# Patient Record
Sex: Female | Born: 1940 | Race: White | Hispanic: No | Marital: Married | Smoking: Never smoker
Health system: Southern US, Community
[De-identification: ages and names within clinical notes are randomized; demographics above are authoritative.]

## PROBLEM LIST (undated history)

## (undated) DIAGNOSIS — H269 Unspecified cataract: Secondary | ICD-10-CM

## (undated) DIAGNOSIS — M810 Age-related osteoporosis without current pathological fracture: Secondary | ICD-10-CM

## (undated) DIAGNOSIS — E538 Deficiency of other specified B group vitamins: Secondary | ICD-10-CM

## (undated) DIAGNOSIS — R238 Other skin changes: Secondary | ICD-10-CM

## (undated) DIAGNOSIS — F329 Major depressive disorder, single episode, unspecified: Secondary | ICD-10-CM

## (undated) DIAGNOSIS — R233 Spontaneous ecchymoses: Secondary | ICD-10-CM

## (undated) DIAGNOSIS — R29898 Other symptoms and signs involving the musculoskeletal system: Secondary | ICD-10-CM

## (undated) DIAGNOSIS — G629 Polyneuropathy, unspecified: Secondary | ICD-10-CM

## (undated) DIAGNOSIS — E785 Hyperlipidemia, unspecified: Secondary | ICD-10-CM

## (undated) DIAGNOSIS — I10 Essential (primary) hypertension: Secondary | ICD-10-CM

## (undated) DIAGNOSIS — M1990 Unspecified osteoarthritis, unspecified site: Secondary | ICD-10-CM

## (undated) DIAGNOSIS — R600 Localized edema: Secondary | ICD-10-CM

## (undated) DIAGNOSIS — E7800 Pure hypercholesterolemia, unspecified: Secondary | ICD-10-CM

## (undated) DIAGNOSIS — M2550 Pain in unspecified joint: Secondary | ICD-10-CM

## (undated) DIAGNOSIS — E782 Mixed hyperlipidemia: Secondary | ICD-10-CM

## (undated) DIAGNOSIS — R2689 Other abnormalities of gait and mobility: Secondary | ICD-10-CM

## (undated) DIAGNOSIS — F32A Depression, unspecified: Secondary | ICD-10-CM

## (undated) DIAGNOSIS — R609 Edema, unspecified: Secondary | ICD-10-CM

## (undated) DIAGNOSIS — L0100 Impetigo, unspecified: Secondary | ICD-10-CM

## (undated) HISTORY — DX: Spontaneous ecchymoses: R23.3

## (undated) HISTORY — DX: Other skin changes: R23.8

## (undated) HISTORY — DX: Depression, unspecified: F32.A

## (undated) HISTORY — DX: Edema, unspecified: R60.9

## (undated) HISTORY — DX: Unspecified osteoarthritis, unspecified site: M19.90

## (undated) HISTORY — PX: BREAST SURGERY: SHX581

## (undated) HISTORY — DX: Impetigo, unspecified: L01.00

## (undated) HISTORY — DX: Other abnormalities of gait and mobility: R26.89

## (undated) HISTORY — DX: Age-related osteoporosis without current pathological fracture: M81.0

## (undated) HISTORY — DX: Polyneuropathy, unspecified: G62.9

## (undated) HISTORY — PX: ABDOMINAL HYSTERECTOMY: SHX81

## (undated) HISTORY — DX: Mixed hyperlipidemia: E78.2

## (undated) HISTORY — PX: CATARACT EXTRACTION: SUR2

## (undated) HISTORY — DX: Pain in unspecified joint: M25.50

## (undated) HISTORY — DX: Localized edema: R60.0

## (undated) HISTORY — DX: Pure hypercholesterolemia, unspecified: E78.00

## (undated) HISTORY — DX: Essential (primary) hypertension: I10

## (undated) HISTORY — DX: Other symptoms and signs involving the musculoskeletal system: R29.898

## (undated) HISTORY — DX: Hyperlipidemia, unspecified: E78.5

## (undated) HISTORY — PX: OTHER SURGICAL HISTORY: SHX169

## (undated) HISTORY — DX: Major depressive disorder, single episode, unspecified: F32.9

## (undated) HISTORY — DX: Deficiency of other specified B group vitamins: E53.8

## (undated) HISTORY — DX: Unspecified cataract: H26.9

---

## 1976-01-17 HISTORY — PX: ABDOMINAL HYSTERECTOMY: SHX81

## 2003-01-20 ENCOUNTER — Encounter: Admission: RE | Admit: 2003-01-20 | Discharge: 2003-01-20 | Payer: Self-pay | Admitting: Family Medicine

## 2003-02-19 ENCOUNTER — Ambulatory Visit (HOSPITAL_BASED_OUTPATIENT_CLINIC_OR_DEPARTMENT_OTHER): Admission: RE | Admit: 2003-02-19 | Discharge: 2003-02-19 | Payer: Self-pay | Admitting: General Surgery

## 2003-02-19 ENCOUNTER — Encounter (INDEPENDENT_AMBULATORY_CARE_PROVIDER_SITE_OTHER): Payer: Self-pay | Admitting: Specialist

## 2003-02-19 ENCOUNTER — Ambulatory Visit (HOSPITAL_COMMUNITY): Admission: RE | Admit: 2003-02-19 | Discharge: 2003-02-19 | Payer: Self-pay | Admitting: General Surgery

## 2004-02-03 ENCOUNTER — Encounter: Admission: RE | Admit: 2004-02-03 | Discharge: 2004-02-03 | Payer: Self-pay | Admitting: Family Medicine

## 2005-02-08 ENCOUNTER — Encounter: Admission: RE | Admit: 2005-02-08 | Discharge: 2005-02-08 | Payer: Self-pay | Admitting: Family Medicine

## 2009-09-07 ENCOUNTER — Encounter: Admission: RE | Admit: 2009-09-07 | Discharge: 2009-09-07 | Payer: Self-pay | Admitting: General Surgery

## 2009-09-09 ENCOUNTER — Ambulatory Visit (HOSPITAL_BASED_OUTPATIENT_CLINIC_OR_DEPARTMENT_OTHER): Admission: RE | Admit: 2009-09-09 | Discharge: 2009-09-09 | Payer: Self-pay | Admitting: General Surgery

## 2010-03-31 LAB — DIFFERENTIAL
Basophils Absolute: 0 10*3/uL (ref 0.0–0.1)
Basophils Relative: 0 % (ref 0–1)
Eosinophils Absolute: 0.3 10*3/uL (ref 0.0–0.7)
Eosinophils Relative: 3 % (ref 0–5)
Lymphocytes Relative: 18 % (ref 12–46)
Lymphs Abs: 1.9 10*3/uL (ref 0.7–4.0)
Monocytes Absolute: 0.7 10*3/uL (ref 0.1–1.0)
Monocytes Relative: 6 % (ref 3–12)
Neutro Abs: 7.7 10*3/uL (ref 1.7–7.7)
Neutrophils Relative %: 72 % (ref 43–77)

## 2010-03-31 LAB — CBC
HCT: 41.9 % (ref 36.0–46.0)
Hemoglobin: 14.4 g/dL (ref 12.0–15.0)
MCH: 30.4 pg (ref 26.0–34.0)
MCHC: 34.4 g/dL (ref 30.0–36.0)
MCV: 88.4 fL (ref 78.0–100.0)
Platelets: 191 10*3/uL (ref 150–400)
RBC: 4.74 MIL/uL (ref 3.87–5.11)
RDW: 13.5 % (ref 11.5–15.5)
WBC: 10.6 10*3/uL — ABNORMAL HIGH (ref 4.0–10.5)

## 2010-03-31 LAB — BASIC METABOLIC PANEL
BUN: 15 mg/dL (ref 6–23)
CO2: 32 mEq/L (ref 19–32)
Calcium: 9.9 mg/dL (ref 8.4–10.5)
Chloride: 105 mEq/L (ref 96–112)
Creatinine, Ser: 0.74 mg/dL (ref 0.4–1.2)
GFR calc Af Amer: >60 mL/min (ref >60)
GFR calc non Af Amer: >60 mL/min (ref >60)
Glucose, Bld: 88 mg/dL (ref 70–99)
Potassium: 5.2 mEq/L — ABNORMAL HIGH (ref 3.5–5.1)
Sodium: 142 mEq/L (ref 135–145)

## 2010-06-03 NOTE — Op Note (Signed)
NAME:  Jasmine Copeland, Jasmine Copeland                           ACCOUNT NO.:  [id number]   MEDICAL RECORD NO.:  [id number]                   PATIENT TYPE:  AMB   LOCATION:  DSC                                  FACILITY:  [facility]   PHYSICIAN:  Rose Phi. Maple Hudson, M.D.                DATE OF BIRTH:  June 28, 1940   DATE OF PROCEDURE:  02/19/2003  DATE OF DISCHARGE:                                 OPERATIVE REPORT   PREOPERATIVE DIAGNOSIS:  Intraductal papilloma, right breast.   POSTOPERATIVE DIAGNOSES:  Intraductal papilloma, right breast.   PROCEDURE:  Excision of intraductal papilloma, right breast.   SURGEON:  Rose Phi. Maple Hudson, M.D.   ANESTHESIA:  MAC.   DESCRIPTION OF PROCEDURE:  The patient was placed on the operating table and  the right breast was prepped and draped in the usual fashion.  The nipple  discharge with a little blood tinge to it came from the 5 o'clock position.  A curved circumareolar incision was then outlined using that as a guide.  I  then infiltrated the area with a mixture of 1% Xylocaine and 0.25% Marcaine.  The incision was made and I then elevated the areolar flap to under the  nipple and then I could see the dilated duct going in at the 5 o'clock  position.  I divided the nipple and then excised the duct system.  Hemostasis was obtained with the cautery.  Subcuticular closure with 4-0  Monocryl and Steri-Strips was carried out.  A dressing was applied.  The  patient was transferred to the recovery room in satisfactory condition  having tolerated the procedure well.                                               Rose Phi. Maple Hudson, M.D.    PRY/MEDQ  D:  02/19/2003  T:  02/19/2003  Job:  [id number]

## 2012-12-10 ENCOUNTER — Ambulatory Visit: Payer: Self-pay | Admitting: Podiatrist

## 2013-06-04 ENCOUNTER — Ambulatory Visit (INDEPENDENT_AMBULATORY_CARE_PROVIDER_SITE_OTHER): Payer: Commercial Managed Care - HMO

## 2013-06-04 VITALS — BP 132/62 | HR 63 | Resp 18

## 2013-06-04 DIAGNOSIS — M722 Plantar fascial fibromatosis: Secondary | ICD-10-CM | POA: Diagnosis not present

## 2013-06-04 DIAGNOSIS — M79609 Pain in unspecified limb: Secondary | ICD-10-CM

## 2013-06-04 NOTE — Progress Notes (Signed)
   Subjective:    Patient ID: Jasmine SellerLinda L Copeland, female    DOB: 11/15/1940, 73 y.o.   MRN: [id number]  HPI my right heel has been going on since last fall and in the center of the heel it is sore as a boil and it finally came out and then went to Dr Hal Neerillis and he x-rayed it and didn't get a shot and gave exercises and then I went back 4 to 6 weeks later and gave a shot and it was no better and then on another visit he gave me another shot in a different spot and I showed him the inserts I got from the store and I got new balance like he said    Review of Systems  Constitutional: Positive for activity change.  Eyes: Positive for visual disturbance.  Musculoskeletal: Positive for gait problem.  Hematological: Bruises/bleeds easily.  All other systems reviewed and are negative.      Objective:   Physical Exam 73-year-old white female presents this time well-developed well-nourished oriented x3 and complaint of the inferior right heel pain greater than 6-9 months patient has tried some treatments has had steroid injections try some over-the-counter heel cups is wearing different shoes although currently wearing a very flimsy soft shoe with inadequate support. Hurts most when she tries to walk barefoot at home her for get up in the middle of the night to the bathroom  Lower extremity objective findings as follows that neurovascular status is intact pedal pulses palpable DP and PT +2/4 bilateral Refill time 3 seconds all digits epicritic and proprioceptive sensations intact and symmetric bilateral there is normal plantar response DTRs not listed dermatologically skin color pigment normal slight yellowing of left hallux nail noted a small the keratoses sub-heel on the right is noted although not painful on direct lateral compression orthopedic biomechanical exam reveals x-rays well-developed inferior calcaneal spurring thickening plantar fascial structures noted there is pain on palpation healed plantar  fascia tubercle medial band of the plantar fascia on the right foot. Left foot asymptomatic.       Assessment & Plan:  Assessment this time is plantar fasciitis/heel spur syndrome right foot plan at this time patient placed in fascial strapping reappointed to followup and reassessment may be candidate for orthoses based on progress no barefoot or flimsy shoes or flip-flops did recommend crocs or Birkenstock for around the house also ice to the heel every evening an NSAID on an as-needed basis reappointed 2 weeks  Alvan Dameichard Analina Filla DPM

## 2013-06-04 NOTE — Patient Instructions (Addendum)
ICE INSTRUCTIONS  Apply ice or cold pack to the affected area at least 3 times a day for 10-15 minutes each time.  You should also use ice after prolonged activity or vigorous exercise.  Do not apply ice longer than 20 minutes at one time.  Always keep a cloth between your skin and the ice pack to prevent burns.  Being consistent and following these instructions will help control your symptoms.  We suggest you purchase a gel ice pack because they are reusable and do bit leak.  Some of them are designed to wrap around the area.  Use the method that works best for you.  Here are some other suggestions for icing.   Use a frozen bag of peas or corn-inexpensive and molds well to your body, usually stays frozen for 10 to 20 minutes.  Wet a towel with cold water and squeeze out the excess until it's damp.  Place in a bag in the freezer for 20 minutes. Then remove and use.  Recommendations for shoes around the house: Suggests wearing crocs or a Birkenstock-type sandal around the house the make appropriate bedroom shoe or slipper. No barefoot no flimsy shoes no flip-flops

## 2013-06-18 ENCOUNTER — Ambulatory Visit (INDEPENDENT_AMBULATORY_CARE_PROVIDER_SITE_OTHER): Payer: Commercial Managed Care - HMO

## 2013-06-18 VITALS — BP 151/68 | HR 71 | Resp 18

## 2013-06-18 DIAGNOSIS — M722 Plantar fascial fibromatosis: Secondary | ICD-10-CM

## 2013-06-18 DIAGNOSIS — M79609 Pain in unspecified limb: Secondary | ICD-10-CM

## 2013-06-18 MED ORDER — MELOXICAM 15 MG PO TABS: 15.0000 mg | ORAL_TABLET | Freq: Every day | ORAL | Status: DC

## 2013-06-18 MED ORDER — TRIAMCINOLONE ACETONIDE 10 MG/ML IJ SUSP: 10.0000 mg | Freq: Once | INTRAMUSCULAR | Status: AC

## 2013-06-18 NOTE — Patient Instructions (Signed)

## 2013-06-18 NOTE — Progress Notes (Signed)
   Subjective:    Patient ID: Jasmine Copeland, female    DOB: [birth year], 73 y.o.   MRN: [id number]  HPI MY RIGHT HEEL IS STILL SORE AND I COULD NOT TELL A DIFFERENCE IN THE TAPING THAT WAS PUT ON AND IT HURTS    Review of Systems no new findings or systemic changes noted     Objective:   Physical Exam Neurovascular status is intact pedal pulses are palpable epicritic and proprioceptive sensations unchanged patient continues to have pain inferior heel area of her right foot for medial band plantar fascia medial calcaneal tubercle area. There was some slight improvement with taping for a period of time with some symptomology however in general continues to pain symptoms has had 2 steroid injections back in December and January with Dr. Berna Spare however it has not any since then had cataract surgery recently had to be off of her NSAID therapies also did not help with improvement. Base and this time patient candidate for orthoses tried OTC type orthotic power step orthotics are dispensed and fitted to the patient's foot at this time with written break in wearing instructions and given. Also at this time as she continues to have pain in symptomology injection tender with Kenalog 20 mg Xylocaine plain infiltrated to the inferior calcaneal area. Fascial strapping is also apply for temporary relief. Break in period for the orthoses.       Assessment & Plan:  Saethre-Chotzen plantar fasciitis/heel spur syndrome will follow biomechanical support utilizing orthotics and fascial strapping at this time an injection tender with Kenalog infiltrated to the inferior right heel recommended to maintain ice and NSAID for prescription MOBIC is: The patient's pharmacy 15 mg once daily as needed for pain recheck in one month for followup and reevaluation possible orthotic adjustment if needed  Alvan Dame DPM

## 2013-07-16 ENCOUNTER — Ambulatory Visit: Payer: Commercial Managed Care - HMO

## 2013-07-23 ENCOUNTER — Ambulatory Visit (INDEPENDENT_AMBULATORY_CARE_PROVIDER_SITE_OTHER): Payer: Commercial Managed Care - HMO

## 2013-07-23 ENCOUNTER — Ambulatory Visit: Payer: Commercial Managed Care - HMO

## 2013-07-23 VITALS — BP 172/76 | HR 58 | Resp 18

## 2013-07-23 DIAGNOSIS — M79609 Pain in unspecified limb: Secondary | ICD-10-CM

## 2013-07-23 DIAGNOSIS — M79606 Pain in leg, unspecified: Secondary | ICD-10-CM

## 2013-07-23 DIAGNOSIS — M722 Plantar fascial fibromatosis: Secondary | ICD-10-CM

## 2013-07-23 NOTE — Patient Instructions (Signed)
ICE INSTRUCTIONS  Apply ice or cold pack to the affected area at least 3 times a day for 10-15 minutes each time.  You should also use ice after prolonged activity or vigorous exercise.  Do not apply ice longer than 20 minutes at one time.  Always keep a cloth between your skin and the ice pack to prevent burns.  Being consistent and following these instructions will help control your symptoms.  We suggest you purchase a gel ice pack because they are reusable and do bit leak.  Some of them are designed to wrap around the area.  Use the method that works best for you.  Here are some other suggestions for icing.   Use a frozen bag of peas or corn-inexpensive and molds well to your body, usually stays frozen for 10 to 20 minutes.  Wet a towel with cold water and squeeze out the excess until it's damp.  Place in a bag in the freezer for 20 minutes. Then remove and use.  Alternative ice method is use a frozen bottle of water like a frozen rolling pin massage and cool down the foot and arch

## 2013-07-23 NOTE — Progress Notes (Signed)
   Subjective:    Patient ID: Jasmine Copeland, female    DOB: 08/05/1940, 73 y.o.   MRN: [id number]  HPI THE INSERTS THAT I GOT FOR $45.00 IT IS HURTING ON THE INSTEP OF BOTH MY FEET AND I FEEL LIKE I AM AT THE BEING AGAIN AND THE INJECTION DID HELP SOME AND THE WRAP DID HELP    Review of Systems no new findings or systemic changes noted     Objective:   Physical Exam Neurovascular status is intact pedal pulses are palpable bilateral epicritic and proprioceptive sensations intact the patient case for the first 4 weeks the orthotics and she felt fine made in the shot in the medications however in the last 2 weeks [place] of her heel pain once again both heel and mid arch pain on the right foot pain on palpation medial band plantar fascia medial calcaneal tubercle on the right left is asymptomatic the orthotics that she seem to fit and contour well however this time added additional quarter-inch felt pad arch pads to the right orthotic in a fascial strapping is applied to the right foot. 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

## 2013-08-21 ENCOUNTER — Ambulatory Visit (INDEPENDENT_AMBULATORY_CARE_PROVIDER_SITE_OTHER): Payer: Commercial Managed Care - HMO

## 2013-08-21 VITALS — BP 186/82 | HR 59 | Resp 18

## 2013-08-21 DIAGNOSIS — M79606 Pain in leg, unspecified: Secondary | ICD-10-CM

## 2013-08-21 DIAGNOSIS — M7750 Other enthesopathy of unspecified foot: Secondary | ICD-10-CM

## 2013-08-21 DIAGNOSIS — M778 Other enthesopathies, not elsewhere classified: Secondary | ICD-10-CM

## 2013-08-21 DIAGNOSIS — M779 Enthesopathy, unspecified: Secondary | ICD-10-CM

## 2013-08-21 DIAGNOSIS — M79609 Pain in unspecified limb: Secondary | ICD-10-CM

## 2013-08-21 DIAGNOSIS — M722 Plantar fascial fibromatosis: Secondary | ICD-10-CM

## 2013-08-21 NOTE — Patient Instructions (Signed)
Physical Therapy Prescription Your caregiver wants you to have physical therapy. Please take this instruction to the physical therapist recommended by your caregiver. Your prescription is: _x_ Evaluate and treat using standard techniques. ___ Evaluate and contact physician before starting treatment. _x Heat therapy: ( ) Ultrasound ( ) Hot Packs ( ) Paraffin. __x_ Electrotherapy: ( ) TENS ( ) Electrical Stimulation. ___ Hydrotherapy. ___ Cryotherapy. _x__ Therapeutic Exercise: ( ) Back ( ) Neck ( ) Gait ( ) Home program. ___ Massage. ___ Traction: ( ) Pelvic ( ) Cervical. ___ Posture and body mechanics education. Treatment Goal: ___________________________________________________________ __3_ Times per week for _3__ weeks. Please send progress note to caregiver at the completion of therapy. Call caregiver if patient's condition becomes worse. Document Released: 02/10/2004 Document Revised: 03/27/2011 Document Reviewed: 01/02/2005 [facility] Patient Information 2015 [place] [employer], [place]. This information is not intended to replace advice given to you by your health care provider. Make sure you discuss any questions you have with your health care provider.   Alvan Dameichard Madia Carvell DPM

## 2013-08-21 NOTE — Progress Notes (Signed)
   Subjective:    Patient ID: Jasmine Copeland, female    DOB: 04/26/1940, 73 y.o.   MRN: [id number]  HPI MY HEELS ARE DOING ARE NOT DOING TOO GOOD AND THE INSERTS THAT I GOT DR Lynnae Ludemann PUT A PAD IN THERE AND IT WAS TOO MUCH AND THE TAPE MADE MY LEG HURT AND FELT LIKE A PULLING AND I BROKE MY FOOT ABOUT 14 YEARS AGO AND IT SEEMS LIKE THAT IS WORSE THAN THE HEELS    Review of Systems no new systemic findings or changes noted     Objective:   Physical Exam 73-year-old white female well-developed well-nourished. History presents this time for followup her heels continue painful symptomatic she is actually even altered gait and may have aggravated her lateral fifth metatarsal base where she had a fracture 14 years ago. Mass mild capsulitis Lisfranc joint lateral column as a result. Not painful at this time it resolved over the last couple of days. The adjustments are felt padding to the orthotics had to be removed and HEENT wear the OTC orthotic or power step insoles. Continues to pain on palpation medial band of the plantar fascia medial calcaneal tubercle bilateral right more so than left. Neurovascular status otherwise intact and unchanged pedal pulses palpable epicritic and proprioceptive sensations intact and symmetric bilateral there is normal plantar response DTRs not elicited [place] skin color pigment normal hair growth normal absent hair growth distally nails unremarkable.       Assessment & Plan:  Assessment this time for calcium plantar fasciitis/heel spur syndrome right more so than left discussed other options including following #1 physical therapy 3 times a week for 4 weeks for which a prescription was issued at this time. Patient will do physical therapy with orthopedic office where her husband is also having physical therapy status post knee surgery. Also discussed the possibilities of surgical intervention in the form of EPF and the possibilities of shockwave therapy or EPAT will  consider these options also the possibility of prescription orthoses versus the OTC orthotic she is currently using. Reevaluate with the next month or 2 if physical therapy hasn't provided improvement or relief  Alvan Dameichard Excell Neyland DPM

## 2013-09-03 ENCOUNTER — Telehealth: Payer: Self-pay | Admitting: *Deleted

## 2013-09-03 NOTE — Telephone Encounter (Signed)
We are requesting the orders for the patient signed by the doctor, copy of insurance card, demographics, and last office visit note.  Fax number is [phone].

## 2013-09-04 NOTE — Telephone Encounter (Signed)
Faxed all the paperwork to [name]. Jasmine Copeland

## 2015-01-26 DIAGNOSIS — H43813 Vitreous degeneration, bilateral: Secondary | ICD-10-CM | POA: Diagnosis not present

## 2015-01-26 DIAGNOSIS — H26492 Other secondary cataract, left eye: Secondary | ICD-10-CM | POA: Diagnosis not present

## 2015-01-26 DIAGNOSIS — Z961 Presence of intraocular lens: Secondary | ICD-10-CM | POA: Diagnosis not present

## 2015-01-26 DIAGNOSIS — H52223 Regular astigmatism, bilateral: Secondary | ICD-10-CM | POA: Diagnosis not present

## 2015-01-26 DIAGNOSIS — H5203 Hypermetropia, bilateral: Secondary | ICD-10-CM | POA: Diagnosis not present

## 2015-01-26 DIAGNOSIS — I10 Essential (primary) hypertension: Secondary | ICD-10-CM | POA: Diagnosis not present

## 2015-01-26 DIAGNOSIS — H524 Presbyopia: Secondary | ICD-10-CM | POA: Diagnosis not present

## 2015-01-26 DIAGNOSIS — H43393 Other vitreous opacities, bilateral: Secondary | ICD-10-CM | POA: Diagnosis not present

## 2015-01-26 DIAGNOSIS — H16223 Keratoconjunctivitis sicca, not specified as Sjogren's, bilateral: Secondary | ICD-10-CM | POA: Diagnosis not present

## 2015-01-26 DIAGNOSIS — H04123 Dry eye syndrome of bilateral lacrimal glands: Secondary | ICD-10-CM | POA: Diagnosis not present

## 2015-01-26 DIAGNOSIS — H35373 Puckering of macula, bilateral: Secondary | ICD-10-CM | POA: Diagnosis not present

## 2015-01-26 DIAGNOSIS — H26491 Other secondary cataract, right eye: Secondary | ICD-10-CM | POA: Diagnosis not present

## 2015-03-08 DIAGNOSIS — H04123 Dry eye syndrome of bilateral lacrimal glands: Secondary | ICD-10-CM | POA: Diagnosis not present

## 2015-06-02 DIAGNOSIS — Z1211 Encounter for screening for malignant neoplasm of colon: Secondary | ICD-10-CM | POA: Diagnosis not present

## 2015-06-02 DIAGNOSIS — E782 Mixed hyperlipidemia: Secondary | ICD-10-CM | POA: Diagnosis not present

## 2015-06-02 DIAGNOSIS — Z0000 Encounter for general adult medical examination without abnormal findings: Secondary | ICD-10-CM | POA: Diagnosis not present

## 2015-06-02 DIAGNOSIS — M8580 Other specified disorders of bone density and structure, unspecified site: Secondary | ICD-10-CM | POA: Diagnosis not present

## 2015-06-02 DIAGNOSIS — Z1389 Encounter for screening for other disorder: Secondary | ICD-10-CM | POA: Diagnosis not present

## 2015-06-02 DIAGNOSIS — Z79899 Other long term (current) drug therapy: Secondary | ICD-10-CM | POA: Diagnosis not present

## 2015-06-02 DIAGNOSIS — M159 Polyosteoarthritis, unspecified: Secondary | ICD-10-CM | POA: Diagnosis not present

## 2015-06-02 DIAGNOSIS — I10 Essential (primary) hypertension: Secondary | ICD-10-CM | POA: Diagnosis not present

## 2015-07-06 DIAGNOSIS — T887XXA Unspecified adverse effect of drug or medicament, initial encounter: Secondary | ICD-10-CM | POA: Diagnosis not present

## 2015-07-06 DIAGNOSIS — I10 Essential (primary) hypertension: Secondary | ICD-10-CM | POA: Diagnosis not present

## 2015-09-24 DIAGNOSIS — Z23 Encounter for immunization: Secondary | ICD-10-CM | POA: Diagnosis not present

## 2015-09-24 DIAGNOSIS — Z0000 Encounter for general adult medical examination without abnormal findings: Secondary | ICD-10-CM | POA: Diagnosis not present

## 2015-10-19 DIAGNOSIS — Z1231 Encounter for screening mammogram for malignant neoplasm of breast: Secondary | ICD-10-CM | POA: Diagnosis not present

## 2016-06-01 DIAGNOSIS — I10 Essential (primary) hypertension: Secondary | ICD-10-CM | POA: Diagnosis not present

## 2016-06-01 DIAGNOSIS — H26491 Other secondary cataract, right eye: Secondary | ICD-10-CM | POA: Diagnosis not present

## 2016-06-01 DIAGNOSIS — H26492 Other secondary cataract, left eye: Secondary | ICD-10-CM | POA: Diagnosis not present

## 2016-06-01 DIAGNOSIS — H52223 Regular astigmatism, bilateral: Secondary | ICD-10-CM | POA: Diagnosis not present

## 2016-06-01 DIAGNOSIS — H524 Presbyopia: Secondary | ICD-10-CM | POA: Diagnosis not present

## 2016-06-01 DIAGNOSIS — H43311 Vitreous membranes and strands, right eye: Secondary | ICD-10-CM | POA: Diagnosis not present

## 2016-06-01 DIAGNOSIS — H43811 Vitreous degeneration, right eye: Secondary | ICD-10-CM | POA: Diagnosis not present

## 2016-06-01 DIAGNOSIS — H5203 Hypermetropia, bilateral: Secondary | ICD-10-CM | POA: Diagnosis not present

## 2016-06-01 DIAGNOSIS — Z961 Presence of intraocular lens: Secondary | ICD-10-CM | POA: Diagnosis not present

## 2016-06-15 DIAGNOSIS — M810 Age-related osteoporosis without current pathological fracture: Secondary | ICD-10-CM | POA: Diagnosis not present

## 2016-06-15 DIAGNOSIS — I10 Essential (primary) hypertension: Secondary | ICD-10-CM | POA: Diagnosis not present

## 2016-06-15 DIAGNOSIS — Z0000 Encounter for general adult medical examination without abnormal findings: Secondary | ICD-10-CM | POA: Diagnosis not present

## 2016-06-15 DIAGNOSIS — R5381 Other malaise: Secondary | ICD-10-CM | POA: Diagnosis not present

## 2016-06-15 DIAGNOSIS — M129 Arthropathy, unspecified: Secondary | ICD-10-CM | POA: Diagnosis not present

## 2016-06-15 DIAGNOSIS — E782 Mixed hyperlipidemia: Secondary | ICD-10-CM | POA: Diagnosis not present

## 2016-06-15 DIAGNOSIS — F419 Anxiety disorder, unspecified: Secondary | ICD-10-CM | POA: Diagnosis not present

## 2016-06-15 DIAGNOSIS — R5383 Other fatigue: Secondary | ICD-10-CM | POA: Diagnosis not present

## 2016-06-15 DIAGNOSIS — Z1231 Encounter for screening mammogram for malignant neoplasm of breast: Secondary | ICD-10-CM | POA: Diagnosis not present

## 2016-06-15 DIAGNOSIS — Z1211 Encounter for screening for malignant neoplasm of colon: Secondary | ICD-10-CM | POA: Diagnosis not present

## 2016-06-15 DIAGNOSIS — R002 Palpitations: Secondary | ICD-10-CM | POA: Diagnosis not present

## 2016-06-21 DIAGNOSIS — M810 Age-related osteoporosis without current pathological fracture: Secondary | ICD-10-CM | POA: Diagnosis not present

## 2016-06-21 DIAGNOSIS — M8589 Other specified disorders of bone density and structure, multiple sites: Secondary | ICD-10-CM | POA: Diagnosis not present

## 2016-06-21 DIAGNOSIS — Z780 Asymptomatic menopausal state: Secondary | ICD-10-CM | POA: Diagnosis not present

## 2016-07-12 DIAGNOSIS — J0100 Acute maxillary sinusitis, unspecified: Secondary | ICD-10-CM | POA: Diagnosis not present

## 2016-07-12 DIAGNOSIS — J209 Acute bronchitis, unspecified: Secondary | ICD-10-CM | POA: Diagnosis not present

## 2016-10-09 DIAGNOSIS — Z23 Encounter for immunization: Secondary | ICD-10-CM | POA: Diagnosis not present

## 2016-10-20 DIAGNOSIS — Z1231 Encounter for screening mammogram for malignant neoplasm of breast: Secondary | ICD-10-CM | POA: Diagnosis not present

## 2017-02-04 DIAGNOSIS — M62838 Other muscle spasm: Secondary | ICD-10-CM | POA: Diagnosis not present

## 2017-06-18 DIAGNOSIS — I10 Essential (primary) hypertension: Secondary | ICD-10-CM | POA: Diagnosis not present

## 2017-06-18 DIAGNOSIS — E782 Mixed hyperlipidemia: Secondary | ICD-10-CM | POA: Diagnosis not present

## 2017-06-20 DIAGNOSIS — M25551 Pain in right hip: Secondary | ICD-10-CM | POA: Diagnosis not present

## 2017-06-20 DIAGNOSIS — Z0000 Encounter for general adult medical examination without abnormal findings: Secondary | ICD-10-CM | POA: Diagnosis not present

## 2017-06-20 DIAGNOSIS — E782 Mixed hyperlipidemia: Secondary | ICD-10-CM | POA: Diagnosis not present

## 2017-06-20 DIAGNOSIS — R0789 Other chest pain: Secondary | ICD-10-CM | POA: Diagnosis not present

## 2017-06-20 DIAGNOSIS — I10 Essential (primary) hypertension: Secondary | ICD-10-CM | POA: Insufficient documentation

## 2017-06-20 DIAGNOSIS — M546 Pain in thoracic spine: Secondary | ICD-10-CM | POA: Diagnosis not present

## 2017-08-03 DIAGNOSIS — I517 Cardiomegaly: Secondary | ICD-10-CM | POA: Diagnosis not present

## 2017-08-03 DIAGNOSIS — R001 Bradycardia, unspecified: Secondary | ICD-10-CM | POA: Diagnosis not present

## 2017-08-14 ENCOUNTER — Encounter: Payer: Self-pay | Admitting: *Deleted

## 2017-08-15 DIAGNOSIS — H2703 Aphakia, bilateral: Secondary | ICD-10-CM | POA: Diagnosis not present

## 2017-08-18 DIAGNOSIS — E785 Hyperlipidemia, unspecified: Secondary | ICD-10-CM | POA: Insufficient documentation

## 2017-08-18 DIAGNOSIS — R079 Chest pain, unspecified: Secondary | ICD-10-CM | POA: Insufficient documentation

## 2017-08-18 NOTE — Progress Notes (Signed)
Cardiology Office Note:    Date:  08/20/2017   ID:  Jasmine SellerLinda L Pepin, DOB 05/20/1940, MRN [id number]  PCP:  Hadley Penobbins, Robert A, MD  Cardiologist:  Norman HerrlichBrian Jakobi Thetford, MD   Referring MD: Hadley Penobbins, Robert A, MD  ASSESSMENT:    1. Chest pain in adult   2. Hyperlipidemia, unspecified hyperlipidemia type   3. Essential hypertension    PLAN:    In order of problems listed above:  1. Her symptom complex is best described as atypical angina there really is no chest discomfort but what she has is exertional severe limiting relieved with rest.  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  Other physical activity is not provocative I think her pretest probability is intermediate and her undergo a stress echo.  If normal I would not do further evaluation if abnormal would benefit from either CT angiography or coronary angiography. 2. Stable continue her current statin she is taking half dose 3. Stable continue current treatment diuretic beta-blocker  Next appointment  PRN   Medication Adjustments/Labs and Tests Ordered: Current medicines are reviewed at length with the patient today.  Concerns regarding medicines are outlined above.  Orders Placed This Encounter  Procedures  . ECHOCARDIOGRAM STRESS TEST   No orders of the defined types were placed in this encounter.    Chief Complaint  Patient presents with  . Chest Pain    History of Present Illness:    Jasmine Copeland is a 77 y.o. female with a history of hypertesnion, hyperlipidemia and FH of premature CAD who is being seen today for the evaluation of chest pain at the request of Hadley Penobbins, Robert A, MD.  Every few months after she does heavy work in the kitchen prolonged standing is the upper extremity she developed severe pain between her scapula which causes her to have to stop and rest with relief.  The pain is quite severe described as an ache forces her to  seek relief and resolves within 5 to 10 minutes if she goes to sit down in a chair.  No associated shortness of breath not pleuritic no GI symptoms or diaphoresis.  It occurs infrequently every few months and does not occur with other activities such as her daily walk with her husband.  She is unsure whether this predominately back problem is also concerned that may represent coronary artery disease she has no history of congenital rheumatic heart disease prior to the visit I reviewed her off office including laboratory studies and personally reviewed the EKG independently  EKG 08/03/17 sinus bradycardia LAE  Past Medical History:  Diagnosis Date  . Arthritis   . Bruises easily   . Cataract   . Hyperlipidemia   . Hypertension   . Mixed dyslipidemia   . Osteoporosis     Past Surgical History:  Procedure Laterality Date  . ABDOMINAL HYSTERECTOMY    . ABDOMINAL HYSTERECTOMY    . BREAST SURGERY    . BREAST SURGERY    . tonsill surgery      Current Medications: Current Meds  Medication Sig  . aspirin 81 MG tablet Take 81 mg by mouth daily.  . bisoprolol-hydrochlorothiazide (ZIAC) 2.5-6.25 MG per tablet Take 1 tablet by mouth daily.   . calcium-vitamin D 250-100 MG-UNIT per tablet Take 1 tablet by mouth 2 (two) times daily. Patient takes 600 mg 2 times a day/lc  . Cholecalciferol (VITAMIN D3) 2000  UNITS TABS Take 1 tablet by mouth daily.   Marland Kitchen gentamicin (GARAMYCIN) 0.3 % ophthalmic solution   . pravastatin (PRAVACHOL) 40 MG tablet Take 20 mg by mouth daily.   Marland Kitchen PROLENSA 0.07 % SOLN    Current Facility-Administered Medications for the 08/20/17 encounter (Office Visit) with Baldo Daub, MD  Medication  . triamcinolone acetonide (KENALOG) 10 MG/ML injection 10 mg     Allergies:   Augmentin [amoxicillin-pot clavulanate] and Levaquin [levofloxacin in d5w]   Social History   Socioeconomic History  . Marital status: Married    Spouse name: Not on file  . Number of children: Not on  file  . Years of education: Not on file  . Highest education level: Not on file  Occupational History  . Not on file  Social Needs  . Financial resource strain: Not on file  . Food insecurity:    Worry: Not on file    Inability: Not on file  . Transportation needs:    Medical: Not on file    Non-medical: Not on file  Tobacco Use  . Smoking status: Never Smoker  . Smokeless tobacco: Never Used  Substance and Sexual Activity  . Alcohol use: No  . Drug use: No  . Sexual activity: Not on file  Lifestyle  . Physical activity:    Days per week: Not on file    Minutes per session: Not on file  . Stress: Not on file  Relationships  . Social connections:    Talks on phone: Not on file    Gets together: Not on file    Attends religious service: Not on file    Active member of club or organization: Not on file    Attends meetings of clubs or organizations: Not on file    Relationship status: Not on file  Other Topics Concern  . Not on file  Social History Narrative  . Not on file     Family History: The patient's family history includes Cancer in her mother; Heart attack in her father; Hypertension in her brother.  ROS:   Review of Systems  Constitution: Negative.  HENT: Negative.   Eyes: Negative.   Cardiovascular: Positive for chest pain (mid thoracic exertional relieved with rest).  Respiratory: Negative.   Endocrine: Negative.   Hematologic/Lymphatic: Negative.   Musculoskeletal: Positive for back pain, joint pain and neck pain.  Gastrointestinal: Negative.   Genitourinary: Negative.   Neurological: Positive for difficulty with concentration (forgetful).  Psychiatric/Behavioral: Negative.   Allergic/Immunologic: Negative.    Please see the history of present illness.     All other systems reviewed and are negative.  EKGs/Labs/Other Studies Reviewed:    The following studies were reviewed today:   Recent Labs:   06/18/17 Chol 155 HDL 53 LDL 110 CMP normal No  results found for requested labs within last 8760 hours.  Recent Lipid Panel No results found for: CHOL, TRIG, HDL, CHOLHDL, VLDL, LDLCALC, LDLDIRECT  Physical Exam:    VS:  BP (!) 144/68 (BP Location: Right Arm, Patient Position: Sitting, Cuff Size: Normal)   Pulse 62   Ht 5\' 8"  (1.727 m)   Wt 172 lb 6.4 oz (78.2 kg)   SpO2 96%   BMI 26.21 kg/m     Wt Readings from Last 3 Encounters:  08/20/17 172 lb 6.4 oz (78.2 kg)     GEN:  Well nourished, well developed in no acute distress HEENT: Normal NECK: No JVD; No carotid bruits LYMPHATICS: No lymphadenopathy  CARDIAC: RRR, no murmurs, rubs, gallops RESPIRATORY:  Clear to auscultation without rales, wheezing or rhonchi  ABDOMEN: Soft, non-tender, non-distended MUSCULOSKELETAL:  No edema; No deformity  SKIN: Warm and dry NEUROLOGIC:  Alert and oriented x 3 PSYCHIATRIC:  Normal affect     Signed, Norman Herrlich, MD  08/20/2017 9:09 AM    [facility]

## 2017-08-20 ENCOUNTER — Encounter: Payer: Self-pay | Admitting: Cardiology

## 2017-08-20 ENCOUNTER — Ambulatory Visit: Payer: PPO | Admitting: Cardiology

## 2017-08-20 VITALS — BP 144/68 | HR 62 | Ht 68.0 in | Wt 172.4 lb

## 2017-08-20 DIAGNOSIS — E785 Hyperlipidemia, unspecified: Secondary | ICD-10-CM | POA: Diagnosis not present

## 2017-08-20 DIAGNOSIS — I10 Essential (primary) hypertension: Secondary | ICD-10-CM | POA: Diagnosis not present

## 2017-08-20 DIAGNOSIS — R079 Chest pain, unspecified: Secondary | ICD-10-CM

## 2017-08-20 NOTE — Patient Instructions (Signed)
Medication Instructions:  Your physician recommends that you continue on your current medications as directed. Please refer to the Current Medication list given to you today.   Labwork: NONE  Testing/Procedures: Your physician has requested that you have a stress echocardiogram. For further information please visit [email or web address]. Please follow instruction sheet as given.    Follow-Up: Your physician recommends that you schedule a follow-up appointment as needed if symptoms worsen or fail to improve.   Any Other Special Instructions Will Be Listed Below (If Applicable).     If you need a refill on your cardiac medications before your next appointment, please call your pharmacy.

## 2017-08-29 ENCOUNTER — Ambulatory Visit (INDEPENDENT_AMBULATORY_CARE_PROVIDER_SITE_OTHER): Payer: PPO

## 2017-08-29 DIAGNOSIS — R079 Chest pain, unspecified: Secondary | ICD-10-CM

## 2017-08-29 NOTE — Progress Notes (Signed)
Stress echocardiogram with limited exam has been performed.  Jimmy Angy Swearengin RDCS 

## 2017-10-09 DIAGNOSIS — Z23 Encounter for immunization: Secondary | ICD-10-CM | POA: Diagnosis not present

## 2017-10-12 DIAGNOSIS — H26492 Other secondary cataract, left eye: Secondary | ICD-10-CM | POA: Diagnosis not present

## 2017-10-12 DIAGNOSIS — H04123 Dry eye syndrome of bilateral lacrimal glands: Secondary | ICD-10-CM | POA: Diagnosis not present

## 2018-01-29 DIAGNOSIS — Z1231 Encounter for screening mammogram for malignant neoplasm of breast: Secondary | ICD-10-CM | POA: Diagnosis not present

## 2018-07-08 DIAGNOSIS — I10 Essential (primary) hypertension: Secondary | ICD-10-CM | POA: Diagnosis not present

## 2018-07-08 DIAGNOSIS — R5381 Other malaise: Secondary | ICD-10-CM | POA: Diagnosis not present

## 2018-07-08 DIAGNOSIS — R5383 Other fatigue: Secondary | ICD-10-CM | POA: Diagnosis not present

## 2018-07-08 DIAGNOSIS — E782 Mixed hyperlipidemia: Secondary | ICD-10-CM | POA: Diagnosis not present

## 2018-07-24 DIAGNOSIS — M129 Arthropathy, unspecified: Secondary | ICD-10-CM | POA: Diagnosis not present

## 2018-07-24 DIAGNOSIS — F0390 Unspecified dementia without behavioral disturbance: Secondary | ICD-10-CM | POA: Diagnosis not present

## 2018-07-24 DIAGNOSIS — I10 Essential (primary) hypertension: Secondary | ICD-10-CM | POA: Diagnosis not present

## 2018-07-24 DIAGNOSIS — E782 Mixed hyperlipidemia: Secondary | ICD-10-CM | POA: Diagnosis not present

## 2018-07-24 DIAGNOSIS — Z0000 Encounter for general adult medical examination without abnormal findings: Secondary | ICD-10-CM | POA: Diagnosis not present

## 2018-07-30 DIAGNOSIS — E538 Deficiency of other specified B group vitamins: Secondary | ICD-10-CM | POA: Diagnosis not present

## 2018-08-13 DIAGNOSIS — R5383 Other fatigue: Secondary | ICD-10-CM | POA: Diagnosis not present

## 2018-08-13 DIAGNOSIS — R5381 Other malaise: Secondary | ICD-10-CM | POA: Diagnosis not present

## 2018-08-20 DIAGNOSIS — R5383 Other fatigue: Secondary | ICD-10-CM | POA: Diagnosis not present

## 2018-08-20 DIAGNOSIS — R5381 Other malaise: Secondary | ICD-10-CM | POA: Diagnosis not present

## 2018-09-03 DIAGNOSIS — E538 Deficiency of other specified B group vitamins: Secondary | ICD-10-CM | POA: Diagnosis not present

## 2018-09-18 DIAGNOSIS — E539 Vitamin B deficiency, unspecified: Secondary | ICD-10-CM | POA: Diagnosis not present

## 2018-10-02 DIAGNOSIS — E539 Vitamin B deficiency, unspecified: Secondary | ICD-10-CM | POA: Diagnosis not present

## 2018-10-07 DIAGNOSIS — L02212 Cutaneous abscess of back [any part, except buttock]: Secondary | ICD-10-CM | POA: Diagnosis not present

## 2018-10-19 DIAGNOSIS — Z23 Encounter for immunization: Secondary | ICD-10-CM | POA: Diagnosis not present

## 2018-10-25 DIAGNOSIS — H26491 Other secondary cataract, right eye: Secondary | ICD-10-CM | POA: Diagnosis not present

## 2018-10-31 DIAGNOSIS — L720 Epidermal cyst: Secondary | ICD-10-CM | POA: Diagnosis not present

## 2018-11-04 DIAGNOSIS — E539 Vitamin B deficiency, unspecified: Secondary | ICD-10-CM | POA: Diagnosis not present

## 2018-12-09 DIAGNOSIS — E539 Vitamin B deficiency, unspecified: Secondary | ICD-10-CM | POA: Diagnosis not present

## 2019-01-13 DIAGNOSIS — E539 Vitamin B deficiency, unspecified: Secondary | ICD-10-CM | POA: Diagnosis not present

## 2019-02-14 DIAGNOSIS — E539 Vitamin B deficiency, unspecified: Secondary | ICD-10-CM | POA: Diagnosis not present

## 2019-02-20 DIAGNOSIS — Z1231 Encounter for screening mammogram for malignant neoplasm of breast: Secondary | ICD-10-CM | POA: Diagnosis not present

## 2019-03-14 DIAGNOSIS — E538 Deficiency of other specified B group vitamins: Secondary | ICD-10-CM | POA: Diagnosis not present

## 2019-03-14 DIAGNOSIS — R5381 Other malaise: Secondary | ICD-10-CM | POA: Diagnosis not present

## 2019-03-14 DIAGNOSIS — R5383 Other fatigue: Secondary | ICD-10-CM | POA: Diagnosis not present

## 2019-03-14 DIAGNOSIS — I10 Essential (primary) hypertension: Secondary | ICD-10-CM | POA: Diagnosis not present

## 2019-03-14 DIAGNOSIS — R0600 Dyspnea, unspecified: Secondary | ICD-10-CM | POA: Diagnosis not present

## 2019-03-14 DIAGNOSIS — I517 Cardiomegaly: Secondary | ICD-10-CM | POA: Diagnosis not present

## 2019-03-14 DIAGNOSIS — R001 Bradycardia, unspecified: Secondary | ICD-10-CM | POA: Diagnosis not present

## 2019-03-19 DIAGNOSIS — R928 Other abnormal and inconclusive findings on diagnostic imaging of breast: Secondary | ICD-10-CM | POA: Diagnosis not present

## 2019-03-19 DIAGNOSIS — R921 Mammographic calcification found on diagnostic imaging of breast: Secondary | ICD-10-CM | POA: Diagnosis not present

## 2019-03-24 DIAGNOSIS — R739 Hyperglycemia, unspecified: Secondary | ICD-10-CM | POA: Diagnosis not present

## 2019-03-24 DIAGNOSIS — R519 Headache, unspecified: Secondary | ICD-10-CM | POA: Diagnosis not present

## 2019-03-24 DIAGNOSIS — R5383 Other fatigue: Secondary | ICD-10-CM | POA: Diagnosis not present

## 2019-03-24 DIAGNOSIS — I160 Hypertensive urgency: Secondary | ICD-10-CM | POA: Diagnosis not present

## 2019-03-24 DIAGNOSIS — R531 Weakness: Secondary | ICD-10-CM | POA: Diagnosis not present

## 2019-03-25 DIAGNOSIS — R5383 Other fatigue: Secondary | ICD-10-CM | POA: Diagnosis not present

## 2019-03-25 DIAGNOSIS — I10 Essential (primary) hypertension: Secondary | ICD-10-CM | POA: Diagnosis not present

## 2019-03-25 DIAGNOSIS — R519 Headache, unspecified: Secondary | ICD-10-CM | POA: Diagnosis not present

## 2019-03-25 DIAGNOSIS — E785 Hyperlipidemia, unspecified: Secondary | ICD-10-CM | POA: Diagnosis not present

## 2019-03-25 DIAGNOSIS — R531 Weakness: Secondary | ICD-10-CM | POA: Diagnosis not present

## 2019-03-25 DIAGNOSIS — E860 Dehydration: Secondary | ICD-10-CM | POA: Diagnosis not present

## 2019-03-25 DIAGNOSIS — R739 Hyperglycemia, unspecified: Secondary | ICD-10-CM | POA: Diagnosis not present

## 2019-03-25 DIAGNOSIS — I160 Hypertensive urgency: Secondary | ICD-10-CM | POA: Diagnosis not present

## 2019-03-25 DIAGNOSIS — I361 Nonrheumatic tricuspid (valve) insufficiency: Secondary | ICD-10-CM | POA: Diagnosis not present

## 2019-03-28 DIAGNOSIS — I10 Essential (primary) hypertension: Secondary | ICD-10-CM | POA: Diagnosis not present

## 2019-03-28 DIAGNOSIS — M8949 Other hypertrophic osteoarthropathy, multiple sites: Secondary | ICD-10-CM | POA: Diagnosis not present

## 2019-03-28 DIAGNOSIS — R5383 Other fatigue: Secondary | ICD-10-CM | POA: Diagnosis not present

## 2019-03-28 DIAGNOSIS — F321 Major depressive disorder, single episode, moderate: Secondary | ICD-10-CM | POA: Diagnosis not present

## 2019-03-28 DIAGNOSIS — R5381 Other malaise: Secondary | ICD-10-CM | POA: Diagnosis not present

## 2019-04-02 DIAGNOSIS — R921 Mammographic calcification found on diagnostic imaging of breast: Secondary | ICD-10-CM | POA: Diagnosis not present

## 2019-04-02 DIAGNOSIS — N6031 Fibrosclerosis of right breast: Secondary | ICD-10-CM | POA: Diagnosis not present

## 2019-04-02 DIAGNOSIS — R928 Other abnormal and inconclusive findings on diagnostic imaging of breast: Secondary | ICD-10-CM | POA: Diagnosis not present

## 2019-05-01 DIAGNOSIS — R2689 Other abnormalities of gait and mobility: Secondary | ICD-10-CM | POA: Diagnosis not present

## 2019-05-12 DIAGNOSIS — M6281 Muscle weakness (generalized): Secondary | ICD-10-CM | POA: Diagnosis not present

## 2019-05-12 DIAGNOSIS — R2689 Other abnormalities of gait and mobility: Secondary | ICD-10-CM | POA: Diagnosis not present

## 2019-05-21 DIAGNOSIS — R2689 Other abnormalities of gait and mobility: Secondary | ICD-10-CM | POA: Diagnosis not present

## 2019-05-21 DIAGNOSIS — M6281 Muscle weakness (generalized): Secondary | ICD-10-CM | POA: Diagnosis not present

## 2019-05-28 DIAGNOSIS — R2689 Other abnormalities of gait and mobility: Secondary | ICD-10-CM | POA: Diagnosis not present

## 2019-05-28 DIAGNOSIS — M6281 Muscle weakness (generalized): Secondary | ICD-10-CM | POA: Diagnosis not present

## 2019-06-04 DIAGNOSIS — M6281 Muscle weakness (generalized): Secondary | ICD-10-CM | POA: Diagnosis not present

## 2019-06-04 DIAGNOSIS — R2689 Other abnormalities of gait and mobility: Secondary | ICD-10-CM | POA: Diagnosis not present

## 2019-06-11 DIAGNOSIS — M6281 Muscle weakness (generalized): Secondary | ICD-10-CM | POA: Diagnosis not present

## 2019-06-11 DIAGNOSIS — R2689 Other abnormalities of gait and mobility: Secondary | ICD-10-CM | POA: Diagnosis not present

## 2019-06-18 DIAGNOSIS — R2689 Other abnormalities of gait and mobility: Secondary | ICD-10-CM | POA: Diagnosis not present

## 2019-06-18 DIAGNOSIS — M6281 Muscle weakness (generalized): Secondary | ICD-10-CM | POA: Diagnosis not present

## 2019-06-25 DIAGNOSIS — R609 Edema, unspecified: Secondary | ICD-10-CM | POA: Diagnosis not present

## 2019-06-25 DIAGNOSIS — I10 Essential (primary) hypertension: Secondary | ICD-10-CM | POA: Diagnosis not present

## 2019-07-08 DIAGNOSIS — M1711 Unilateral primary osteoarthritis, right knee: Secondary | ICD-10-CM | POA: Diagnosis not present

## 2019-07-08 DIAGNOSIS — M25561 Pain in right knee: Secondary | ICD-10-CM | POA: Diagnosis not present

## 2019-08-26 DIAGNOSIS — G609 Hereditary and idiopathic neuropathy, unspecified: Secondary | ICD-10-CM | POA: Diagnosis not present

## 2019-08-26 DIAGNOSIS — M7741 Metatarsalgia, right foot: Secondary | ICD-10-CM | POA: Diagnosis not present

## 2019-08-26 DIAGNOSIS — M7742 Metatarsalgia, left foot: Secondary | ICD-10-CM | POA: Diagnosis not present

## 2019-08-26 DIAGNOSIS — R208 Other disturbances of skin sensation: Secondary | ICD-10-CM | POA: Diagnosis not present

## 2019-10-13 DIAGNOSIS — Z23 Encounter for immunization: Secondary | ICD-10-CM | POA: Diagnosis not present

## 2019-10-23 DIAGNOSIS — M1711 Unilateral primary osteoarthritis, right knee: Secondary | ICD-10-CM | POA: Diagnosis not present

## 2019-10-23 DIAGNOSIS — M25551 Pain in right hip: Secondary | ICD-10-CM | POA: Diagnosis not present

## 2019-11-06 DIAGNOSIS — M2550 Pain in unspecified joint: Secondary | ICD-10-CM | POA: Diagnosis not present

## 2019-11-06 DIAGNOSIS — Z0001 Encounter for general adult medical examination with abnormal findings: Secondary | ICD-10-CM | POA: Diagnosis not present

## 2019-11-06 DIAGNOSIS — E782 Mixed hyperlipidemia: Secondary | ICD-10-CM | POA: Diagnosis not present

## 2019-11-06 DIAGNOSIS — I10 Essential (primary) hypertension: Secondary | ICD-10-CM | POA: Diagnosis not present

## 2019-11-06 DIAGNOSIS — L0100 Impetigo, unspecified: Secondary | ICD-10-CM | POA: Diagnosis not present

## 2019-11-06 DIAGNOSIS — R5383 Other fatigue: Secondary | ICD-10-CM | POA: Diagnosis not present

## 2019-11-06 DIAGNOSIS — F3341 Major depressive disorder, recurrent, in partial remission: Secondary | ICD-10-CM | POA: Diagnosis not present

## 2019-11-06 DIAGNOSIS — E538 Deficiency of other specified B group vitamins: Secondary | ICD-10-CM | POA: Diagnosis not present

## 2019-11-11 DIAGNOSIS — L0100 Impetigo, unspecified: Secondary | ICD-10-CM | POA: Diagnosis not present

## 2019-11-11 DIAGNOSIS — E782 Mixed hyperlipidemia: Secondary | ICD-10-CM | POA: Diagnosis not present

## 2019-11-11 DIAGNOSIS — I10 Essential (primary) hypertension: Secondary | ICD-10-CM | POA: Diagnosis not present

## 2019-11-11 DIAGNOSIS — F3341 Major depressive disorder, recurrent, in partial remission: Secondary | ICD-10-CM | POA: Diagnosis not present

## 2019-11-11 DIAGNOSIS — M2550 Pain in unspecified joint: Secondary | ICD-10-CM | POA: Diagnosis not present

## 2019-11-11 DIAGNOSIS — Z0000 Encounter for general adult medical examination without abnormal findings: Secondary | ICD-10-CM | POA: Diagnosis not present

## 2019-11-18 DIAGNOSIS — E538 Deficiency of other specified B group vitamins: Secondary | ICD-10-CM | POA: Diagnosis not present

## 2019-11-20 DIAGNOSIS — M1711 Unilateral primary osteoarthritis, right knee: Secondary | ICD-10-CM | POA: Diagnosis not present

## 2019-11-20 DIAGNOSIS — M7061 Trochanteric bursitis, right hip: Secondary | ICD-10-CM | POA: Diagnosis not present

## 2019-11-27 DIAGNOSIS — M1711 Unilateral primary osteoarthritis, right knee: Secondary | ICD-10-CM | POA: Diagnosis not present

## 2019-12-04 DIAGNOSIS — M1711 Unilateral primary osteoarthritis, right knee: Secondary | ICD-10-CM | POA: Diagnosis not present

## 2019-12-18 DIAGNOSIS — E538 Deficiency of other specified B group vitamins: Secondary | ICD-10-CM | POA: Diagnosis not present

## 2020-01-22 DIAGNOSIS — M1711 Unilateral primary osteoarthritis, right knee: Secondary | ICD-10-CM | POA: Diagnosis not present

## 2020-01-28 DIAGNOSIS — E538 Deficiency of other specified B group vitamins: Secondary | ICD-10-CM | POA: Diagnosis not present

## 2020-02-11 DIAGNOSIS — R29898 Other symptoms and signs involving the musculoskeletal system: Secondary | ICD-10-CM | POA: Diagnosis not present

## 2020-02-11 DIAGNOSIS — F3342 Major depressive disorder, recurrent, in full remission: Secondary | ICD-10-CM | POA: Diagnosis not present

## 2020-02-11 DIAGNOSIS — L0100 Impetigo, unspecified: Secondary | ICD-10-CM | POA: Diagnosis not present

## 2020-02-11 DIAGNOSIS — M2550 Pain in unspecified joint: Secondary | ICD-10-CM | POA: Diagnosis not present

## 2020-02-11 DIAGNOSIS — F3341 Major depressive disorder, recurrent, in partial remission: Secondary | ICD-10-CM | POA: Diagnosis not present

## 2020-02-11 DIAGNOSIS — I10 Essential (primary) hypertension: Secondary | ICD-10-CM | POA: Diagnosis not present

## 2020-02-25 DIAGNOSIS — E538 Deficiency of other specified B group vitamins: Secondary | ICD-10-CM | POA: Diagnosis not present

## 2020-03-25 DIAGNOSIS — E538 Deficiency of other specified B group vitamins: Secondary | ICD-10-CM | POA: Diagnosis not present

## 2020-04-07 ENCOUNTER — Encounter: Payer: Self-pay | Admitting: *Deleted

## 2020-04-07 ENCOUNTER — Other Ambulatory Visit: Payer: Self-pay | Admitting: *Deleted

## 2020-04-13 ENCOUNTER — Telehealth: Payer: Self-pay | Admitting: Neurology

## 2020-04-13 ENCOUNTER — Ambulatory Visit: Payer: PPO | Admitting: Neurology

## 2020-04-13 ENCOUNTER — Other Ambulatory Visit: Payer: Self-pay

## 2020-04-13 ENCOUNTER — Encounter: Payer: Self-pay | Admitting: Neurology

## 2020-04-13 VITALS — BP 128/66 | HR 68 | Ht 67.0 in | Wt 171.2 lb

## 2020-04-13 DIAGNOSIS — R413 Other amnesia: Secondary | ICD-10-CM | POA: Diagnosis not present

## 2020-04-13 DIAGNOSIS — R202 Paresthesia of skin: Secondary | ICD-10-CM | POA: Diagnosis not present

## 2020-04-13 MED ORDER — GABAPENTIN 100 MG PO CAPS: 100.0000 mg | ORAL_CAPSULE | Freq: Three times a day (TID) | ORAL | 3 refills | Status: DC

## 2020-04-13 NOTE — Patient Instructions (Addendum)
We will start gabapentin for the burning in the feet.  Neurontin (gabapentin) may result in drowsiness, ankle swelling, gait instability, or possibly dizziness. Please contact our office if significant side effects occur with this medication.

## 2020-04-13 NOTE — Telephone Encounter (Signed)
health team order sent to GI. No auth they will reach out to the patient to schedule.  

## 2020-04-13 NOTE — Progress Notes (Signed)
Reason for visit: Memory problems, burning in the feet, leg weakness  Referring physician: Dr. Morton Peters is a 80 y.o. female  History of present illness:  Jasmine Copeland is a 80 year old right-handed white female with a history of vitamin B12 deficiency and dyslipidemia on a statin drug.  The patient is on B12 shots.  Over the last 1/2 to 2 years she has had onset of some troubles with short-term memory that has gradually worsened over time.  She notes some difficulty with short-term memory, she has difficulty remembering names for people.  She is able to keep up with medications and appointments fairly well and she is able to do the finances without difficulty.  She is able to operate a motor vehicle, she has always had troubles with directions with driving.  This has not changed.  She is sleeping fairly well, the burning in the feet seem to be worse during the day, better at night.  She reports chronic arthralgias including involving the neck and low back, and the hips.  She has arthritis affecting the hands bilaterally.  She denies any radicular pain down the arms or legs.  She feels somewhat weak in the legs and has some difficulty with balance.  She has burning in the feet, mainly in the balls of the feet.  She does occasionally have cramps in the feet but otherwise no significant problems with muscle cramps.  She denies issues controlling the bowels or the bladder.  She does at times have a sensation of weakness in the arms as well as the legs.  The symptoms are about equal from 1 side to the next.  Due to her above symptoms, she is sent to this office for an evaluation.  She reports that her mother also had some balance issues and had memory problems.  Past Medical History:  Diagnosis Date  . Arthralgia   . Arthritis   . Balance problem   . Bilateral leg weakness   . Bruises easily   . Cataract   . Depression   . High cholesterol   . Hyperlipidemia   . Hypertension   .  Impetigo   . MDD (major depressive disorder)   . Mixed dyslipidemia   . Osteoporosis   . Peripheral edema   . Peripheral neuropathy   . Vitamin B 12 deficiency     Past Surgical History:  Procedure Laterality Date  . ABDOMINAL HYSTERECTOMY  1978  . BREAST SURGERY    . CATARACT EXTRACTION    . tonsill surgery      Family History  Problem Relation Age of Onset  . Cancer Mother   . Alzheimer's disease Mother   . Heart attack Father   . Hypertension Brother     Social history:  reports that she has never smoked. She has never used smokeless tobacco. She reports that she does not drink alcohol and does not use drugs.  Medications:  Prior to Admission medications   Medication Sig Start Date End Date Taking? Authorizing Provider  amLODipine (NORVASC) 5 MG tablet Take 1 tablet by mouth daily. 02/11/20  Yes [provider]  aspirin 81 MG tablet Take 81 mg by mouth daily.   Yes [provider]  Calcium Carb-Cholecalciferol [phone] MG-UNIT TABS Take 1 tablet by mouth daily.   Yes [provider]  celecoxib (CELEBREX) 200 MG capsule Take 200 mg by mouth in the morning and at bedtime. 11/11/19  Yes [provider]  cyanocobalamin (,  VITAMIN B-12,) 1000 MCG/ML injection Inject into the muscle. 11/19/19  Yes [provider]  lisinopril-hydrochlorothiazide (ZESTORETIC) 20-12.5 MG tablet Take 1 tablet by mouth daily. 06/25/19  Yes [provider]  mirtazapine (REMERON) 15 MG tablet Take 25 mg by mouth at bedtime. 03/12/20  Yes [provider]  pravastatin (PRAVACHOL) 40 MG tablet Take 20 mg by mouth daily.    Yes [provider]  bisoprolol-hydrochlorothiazide (ZIAC) 2.5-6.25 MG per tablet Take 1 tablet by mouth daily.  Patient not taking: Reported on 04/13/2020 04/21/13   [provider]  Cholecalciferol (VITAMIN D3) 2000 UNITS TABS Take 1 tablet by mouth daily.  Patient not taking: Reported on 04/13/2020    [provider]  gentamicin (GARAMYCIN) 0.3 % ophthalmic solution  05/29/13   [provider]  PARoxetine (PAXIL) 10 MG tablet  02/11/20   [provider]  PROLENSA 0.07 % SOLN  06/01/13   [provider]      Allergies  Allergen Reactions  . Augmentin [Amoxicillin-Pot Clavulanate]     C-diff, mental status change  . Levaquin [Levofloxacin In D5w]     Insomnia    ROS:  Out of a complete 14 system review of symptoms, the patient complains only of the following symptoms, and all other reviewed systems are negative.  Walking difficulty Fatigue Hearing loss, dry mouth Easy bruising, easy bleeding Arthritis Memory loss, weakness  Blood pressure 128/66, pulse 68, height 5\' 7"  (1.702 m), weight 171 lb 3.2 oz (77.7 kg).  Physical Exam  General: The patient is alert and cooperative at the time of the examination.  Eyes: Pupils are equal, round, and reactive to light. Discs are flat bilaterally.  Neck: The neck is supple, no carotid bruits are noted.  Respiratory: The respiratory examination is clear.  Cardiovascular: The cardiovascular examination reveals a regular rate and rhythm, no obvious murmurs or rubs are noted.  Skin: Extremities are without significant edema.  Neurologic Exam  Mental status: The patient is alert and oriented x 3 at the time of the examination. The Mini-Mental status examination done today shows a total score 23/30.  The patient is able to name 14 four-legged animals in 60 seconds.  Cranial nerves: Facial symmetry is present. There is good sensation of the face to pinprick and soft touch bilaterally. The strength of the facial muscles and the muscles to head turning and shoulder shrug are normal bilaterally. Speech is well enunciated, no aphasia or dysarthria is noted. Extraocular movements are full. Visual fields are full. The tongue is midline, and the patient has symmetric elevation of the soft palate. No obvious hearing deficits  are noted.  Motor: The motor testing reveals 5 over 5 strength of all 4 extremities. Good symmetric motor tone is noted throughout.  Sensory: Sensory testing is intact to pinprick, soft touch, vibration sensation, and position sense on all 4 extremities.  No stocking glove sensory pattern loss was noted.  No evidence of extinction is noted.  Coordination: Cerebellar testing reveals good finger-nose-finger and heel-to-shin bilaterally.  Gait and station: Gait is normal. Tandem gait is very minimally unsteady. Romberg is negative. No drift is seen.  The patient is able to walk on the heels and the toes bilaterally.  Reflexes: Deep tendon reflexes are symmetric and normal bilaterally.  The ankle jerk reflexes are well-maintained bilaterally.  Toes are downgoing bilaterally.   Assessment/Plan:  1.  Burning in the feet  2.  Reports of memory disturbance  3.  Vitamin B12 deficiency  The patient reports a sensation of weakness throughout and some burning in the feet that is worse in the daytime, better at night.  The clinical examination does not show any sensory alterations and there is no evidence of any reduction of deep tendon reflexes.  The patient could potentially have a small fiber neuropathy, but likely does not have a significant axonal peripheral neuropathy.  She will be set up for nerve conduction studies on both legs and EMG on 1 leg.  She will have MRI of the brain to evaluate the memory issues.  Blood work will be done today.  She will follow-up for the above study and otherwise follow-up in 4 months.  If the nerve conduction study is completely normal, we may consider MRI of the cervical or lumbar spine.   Marlan Palau MD 04/13/2020 9:42 AM  [facility] [address] [place], [place] [address]  Phone [phone] Fax [phone]

## 2020-04-15 LAB — MULTIPLE MYELOMA PANEL, SERUM
Albumin SerPl Elph-Mcnc: 3.7 g/dL (ref 2.9–4.4)
Albumin/Glob SerPl: 1.4 (ref 0.7–1.7)
Alpha 1: 0.3 g/dL (ref 0.0–0.4)
Alpha2 Glob SerPl Elph-Mcnc: 0.7 g/dL (ref 0.4–1.0)
B-Globulin SerPl Elph-Mcnc: 0.9 g/dL (ref 0.7–1.3)
Gamma Glob SerPl Elph-Mcnc: 0.9 g/dL (ref 0.4–1.8)
Globulin, Total: 2.7 g/dL (ref 2.2–3.9)
IgA/Immunoglobulin A, Serum: 268 mg/dL (ref 64–422)
IgG (Immunoglobin G), Serum: 850 mg/dL (ref 586–1602)
IgM (Immunoglobulin M), Srm: 73 mg/dL (ref 26–217)
Total Protein: 6.4 g/dL (ref 6.0–8.5)

## 2020-04-15 LAB — RPR: RPR Ser Ql: NONREACTIVE

## 2020-04-15 LAB — HEPATITIS C ANTIBODY: Hep C Virus Ab: <0.1 s/co ratio (ref 0.0–0.9)

## 2020-04-15 LAB — B. BURGDORFI ANTIBODIES: Lyme IgG/IgM Ab: <0.91 {ISR} (ref 0.00–0.90)

## 2020-04-15 LAB — ANGIOTENSIN CONVERTING ENZYME: Angio Convert Enzyme: 10 U/L — ABNORMAL LOW (ref 14–82)

## 2020-04-15 LAB — ANA W/REFLEX: Anti Nuclear Antibody (ANA): NEGATIVE

## 2020-04-15 LAB — RHEUMATOID FACTOR: Rheumatoid fact SerPl-aCnc: <10 IU/mL (ref <14.0)

## 2020-04-21 ENCOUNTER — Telehealth: Payer: Self-pay | Admitting: *Deleted

## 2020-04-21 NOTE — Telephone Encounter (Signed)
-----   Message from Arther Abbott, RN sent at 04/21/2020 11:33 AM EDT -----  ----- Message ----- From: York Spaniel, MD Sent: 04/15/2020   5:07 PM EDT To: Lenise Arena, RN   The blood work results are unremarkable. Please call the patient.  ----- Message ----- From: Nell Range Lab Results In Sent: 04/14/2020   7:37 AM EDT To: York Spaniel, MD

## 2020-04-21 NOTE — Telephone Encounter (Signed)
LVM for pt about results per Dr. Anne Hahn' note. Advised she did not have to call back unless she had further questions.

## 2020-04-27 ENCOUNTER — Encounter: Payer: PPO | Admitting: Neurology

## 2020-04-28 ENCOUNTER — Other Ambulatory Visit: Payer: Self-pay

## 2020-04-28 ENCOUNTER — Ambulatory Visit
Admission: RE | Admit: 2020-04-28 | Discharge: 2020-04-28 | Disposition: A | Discharge status: Home / Self Care | Payer: PPO | Source: Ambulatory Visit | Attending: Neurology | Admitting: Neurology

## 2020-04-28 DIAGNOSIS — R413 Other amnesia: Secondary | ICD-10-CM

## 2020-04-30 ENCOUNTER — Telehealth: Payer: Self-pay | Admitting: Neurology

## 2020-04-30 NOTE — Telephone Encounter (Signed)
I called the patient.  MRI of the brain was relatively unremarkable, mild atrophy seen.  No significant small vessel disease.  At some point, the patient may decide that she wishes to go on medication for memory, she will let me know if this is the case.    MRI brain 04/29/20:  IMPRESSION:   MRI brain without contrast demonstrating: -Mild generalized atrophy.  Mild foci of nonspecific gliosis. -No acute findings.

## 2020-05-24 ENCOUNTER — Ambulatory Visit (INDEPENDENT_AMBULATORY_CARE_PROVIDER_SITE_OTHER): Payer: PPO | Admitting: Neurology

## 2020-05-24 ENCOUNTER — Telehealth: Payer: Self-pay | Admitting: Neurology

## 2020-05-24 ENCOUNTER — Ambulatory Visit: Payer: PPO | Admitting: Neurology

## 2020-05-24 ENCOUNTER — Encounter: Payer: Self-pay | Admitting: Neurology

## 2020-05-24 DIAGNOSIS — M545 Low back pain, unspecified: Secondary | ICD-10-CM | POA: Diagnosis not present

## 2020-05-24 DIAGNOSIS — R202 Paresthesia of skin: Secondary | ICD-10-CM | POA: Diagnosis not present

## 2020-05-24 DIAGNOSIS — G8929 Other chronic pain: Secondary | ICD-10-CM

## 2020-05-24 NOTE — Progress Notes (Addendum)
Patient comes in today for EMG and nerve conduction study evaluation.  Nerve conductions of both legs were unremarkable, EMG on the left leg was unremarkable.  The patient reports a 2 or 3-year history of low back pain that develops with standing and will spread up the low back into the shoulders and neck.  When she sits down this will improve.  The symptoms have worsened over time.  We will check it MRI of the lumbar spine to see if an explanation for her lower extremity paresthesias can be noted.   MNC    Nerve / Sites Muscle Latency Ref. Amplitude Ref. Rel Amp Segments Distance Velocity Ref. Area    ms ms mV mV %  cm m/s m/s mVms  R Peroneal - EDB     Ankle EDB 3.9 ?6.5 2.6 ?2.0 100 Ankle - EDB 9   8.7     Fib head EDB 9.7  2.3  87.9 Fib head - Ankle 26 45 ?44 7.8     Pop fossa EDB 11.9  2.2  94.9 Pop fossa - Fib head 10 45 ?44 7.7         Pop fossa - Ankle      L Peroneal - EDB     Ankle EDB 4.0 ?6.5 2.6 ?2.0 100 Ankle - EDB 9   8.3     Fib head EDB 9.7  2.3  86.7 Fib head - Ankle 27 47 ?44 7.5     Pop fossa EDB 11.9  2.2  94 Pop fossa - Fib head 10 46 ?44 7.4         Pop fossa - Ankle      R Tibial - AH     Ankle AH 3.1 ?5.8 7.3 ?4.0 100 Ankle - AH 9   11.9     Pop fossa AH 12.4  4.7  65.5 Pop fossa - Ankle 40 43 ?41 8.9  L Tibial - AH     Ankle AH 3.6 ?5.8 6.7 ?4.0 100 Ankle - AH 9   10.7     Pop fossa AH 12.3  4.3  64.1 Pop fossa - Ankle 40 46 ?41 9.2             SNC    Nerve / Sites Rec. Site Peak Lat Ref.  Amp Ref. Segments Distance    ms ms V V  cm  R Sural - Ankle (Calf)     Calf Ankle 3.2 ?4.4 4 ?6 Calf - Ankle 14  L Sural - Ankle (Calf)     Calf Ankle 3.1 ?4.4 4 ?6 Calf - Ankle 14  R Superficial peroneal - Ankle     Lat leg Ankle 3.9 ?4.4 2 ?6 Lat leg - Ankle 14  L Superficial peroneal - Ankle     Lat leg Ankle 3.5 ?4.4 3 ?6 Lat leg - Ankle 14             F  Wave    Nerve F Lat Ref.   ms ms  R Tibial - AH 53.5 ?56.0  L Tibial - AH 55.0 ?56.0

## 2020-05-24 NOTE — Procedures (Signed)
     HISTORY:  Jasmine Copeland is a 80 year old patient with a history of paresthesias in the feet, she also has had some low back pain spreading to the neck and shoulders with standing, better with sitting or resting.  She is being evaluated for this issue.  NERVE CONDUCTION STUDIES:  Nerve conduction studies were performed on both lower extremities. The distal motor latencies and motor amplitudes for the peroneal and posterior tibial nerves were within normal limits. The nerve conduction velocities for these nerves were also normal. The sensory latencies for the peroneal and sural nerves were within normal limits. The F wave latencies for the posterior tibial nerves were within normal limits.   EMG STUDIES:  EMG study was performed on the left lower extremity:  The tibialis anterior muscle reveals 2 to 4K motor units with full recruitment. No fibrillations or positive waves were seen. The peroneus tertius muscle reveals 2 to 4K motor units with full recruitment. No fibrillations or positive waves were seen. The medial gastrocnemius muscle reveals 1 to 3K motor units with full recruitment. No fibrillations or positive waves were seen. The vastus lateralis muscle reveals 2 to 4K motor units with full recruitment. No fibrillations or positive waves were seen. The iliopsoas muscle reveals 2 to 4K motor units with full recruitment. No fibrillations or positive waves were seen. The biceps femoris muscle (long head) reveals 2 to 4K motor units with full recruitment. No fibrillations or positive waves were seen. The lumbosacral paraspinal muscles were tested at 3 levels, and revealed no abnormalities of insertional activity at all 3 levels tested. There was good relaxation.   IMPRESSION:  Nerve conduction studies on both lower extremities were within normal limits.  No evidence of a neuropathy was seen.  EMG evaluation of the left lower extremity was unremarkable, no evidence of a lumbar  radiculopathy was seen.  Marlan Palau MD 05/24/2020 2:11 PM  [facility] [address] [place], [place] [address]  Phone [phone] Fax [phone]

## 2020-05-24 NOTE — Progress Notes (Signed)
Please refer to EMG and nerve conduction procedure note.  

## 2020-05-24 NOTE — Telephone Encounter (Signed)
Health team order sent to GI. No auth they will reach out to the patient to schedule.  

## 2020-05-26 DIAGNOSIS — E538 Deficiency of other specified B group vitamins: Secondary | ICD-10-CM | POA: Diagnosis not present

## 2020-05-31 ENCOUNTER — Other Ambulatory Visit: Payer: Self-pay

## 2020-05-31 ENCOUNTER — Ambulatory Visit
Admission: RE | Admit: 2020-05-31 | Discharge: 2020-05-31 | Disposition: A | Discharge status: Home / Self Care | Payer: PPO | Source: Ambulatory Visit | Attending: Neurology | Admitting: Neurology

## 2020-05-31 DIAGNOSIS — G8929 Other chronic pain: Secondary | ICD-10-CM

## 2020-05-31 DIAGNOSIS — M545 Low back pain, unspecified: Secondary | ICD-10-CM | POA: Diagnosis not present

## 2020-05-31 DIAGNOSIS — R202 Paresthesia of skin: Secondary | ICD-10-CM | POA: Diagnosis not present

## 2020-06-01 ENCOUNTER — Telehealth: Payer: Self-pay | Admitting: Neurology

## 2020-06-01 DIAGNOSIS — M542 Cervicalgia: Secondary | ICD-10-CM

## 2020-06-01 DIAGNOSIS — M48062 Spinal stenosis, lumbar region with neurogenic claudication: Secondary | ICD-10-CM

## 2020-06-01 NOTE — Telephone Encounter (Signed)
I called the patient.  The MRI of the low back shows severe spinal stenosis at L4-5 level, this may explain some of the sensory changes in the legs and the back pain.  I would recommend an epidural injection of the low back first.  If patient is amenable to this, she will call our office.   MRI lumbar 06/01/20:  IMPRESSION: This MRI of the lumbar spine without contrast shows the following: 1.   At L3-L4, there is moderate spinal stenosis due to degenerative changes.  There is no significant foraminal narrowing or lateral recess stenosis.  No nerve root compression. 2.   At L4-L5, there is severe spinal stenosis (AP diameter 6.0 mm) due to anterolisthesis and other degenerative changes.  There is moderate to severe right lateral recess stenosis with potential for right L5 nerve root compression. 3.   At L5-S1, there is severe loss of disc height with possible degenerative fusion and facet hypertrophy but no nerve root compression or spinal stenosis.

## 2020-06-02 NOTE — Addendum Note (Signed)
Addended by: York Spaniel on: 06/02/2020 04:26 PM   Modules accepted: Orders

## 2020-06-02 NOTE — Telephone Encounter (Signed)
I called the patient.  MRI of the lumbar spine did not show severe spinal stenosis at the L4-5 level, moderate at the L3-4 level, she is amenable to getting the epidural injection, I will get this set up.  The patient goes on to say that she has had significant neck pain and pain into the shoulder blades and all the way down the spine as well.  She denies any radicular pain into the arms.  The pain is much worse when she is up and active, seems to get better when she lies down and rests.  I will go ahead and get an MRI of the cervical spine as well.

## 2020-06-02 NOTE — Telephone Encounter (Signed)
Patient called back, stated she wants the epidural injection.    Dr. Anne Hahn notified.

## 2020-06-02 NOTE — Telephone Encounter (Signed)
Pt returned call. Please call back when available to discuss options.

## 2020-06-03 ENCOUNTER — Other Ambulatory Visit: Payer: Self-pay | Admitting: Neurology

## 2020-06-03 DIAGNOSIS — M48062 Spinal stenosis, lumbar region with neurogenic claudication: Secondary | ICD-10-CM

## 2020-06-03 NOTE — Telephone Encounter (Signed)
MRI Cervical Spine wo contrast sent to [facility]. NPR for [employer] insurance.

## 2020-06-10 ENCOUNTER — Ambulatory Visit
Admission: RE | Admit: 2020-06-10 | Discharge: 2020-06-10 | Disposition: A | Discharge status: Home / Self Care | Payer: PPO | Source: Ambulatory Visit | Attending: Neurology | Admitting: Neurology

## 2020-06-10 ENCOUNTER — Other Ambulatory Visit: Payer: Self-pay

## 2020-06-10 DIAGNOSIS — M47817 Spondylosis without myelopathy or radiculopathy, lumbosacral region: Secondary | ICD-10-CM | POA: Diagnosis not present

## 2020-06-10 DIAGNOSIS — M48062 Spinal stenosis, lumbar region with neurogenic claudication: Secondary | ICD-10-CM

## 2020-06-10 DIAGNOSIS — M5136 Other intervertebral disc degeneration, lumbar region: Secondary | ICD-10-CM | POA: Diagnosis not present

## 2020-06-10 MED ORDER — METHYLPREDNISOLONE ACETATE 40 MG/ML INJ SUSP (RADIOLOG
120.0000 mg | Freq: Once | INTRAMUSCULAR | Status: AC
  Administered 2020-06-10: 120 mg via EPIDURAL

## 2020-06-10 MED ORDER — IOPAMIDOL (ISOVUE-M 200) INJECTION 41%
1.0000 mL | Freq: Once | INTRAMUSCULAR | Status: AC
  Administered 2020-06-10: 1 mL via EPIDURAL

## 2020-06-10 NOTE — Discharge Instructions (Signed)
Post Procedure Spinal Discharge Instruction Sheet  1. You may resume a regular diet and any medications that you routinely take (including pain medications).  2. No driving day of procedure.  3. Light activity throughout the rest of the day.  Do not do any strenuous work, exercise, bending or lifting.  The day following the procedure, you can resume normal physical activity but you should refrain from exercising or physical therapy for at least three days thereafter.   Common Side Effects:   Headaches- take your usual medications as directed by your physician.  Increase your fluid intake.  Caffeinated beverages may be helpful.  Lie flat in bed until your headache resolves.   Restlessness or inability to sleep- you may have trouble sleeping for the next few days.  Ask your referring physician if you need any medication for sleep.   Facial flushing or redness- should subside within a few days.   Increased pain- a temporary increase in pain a day or two following your procedure is not unusual.  Take your pain medication as prescribed by your referring physician.   Leg cramps  Please contact our office at [phone] for the following symptoms:  Fever greater than 100 degrees.  Headaches unresolved with medication after 2-3 days.  Increased swelling, pain, or redness at injection site.   Thank you for visiting [facility] today.   YOU MAY RESUME YOUR ASPRIN ANYTIME AFTER PROCEDURE TODAY 06/10/20 

## 2020-06-15 ENCOUNTER — Ambulatory Visit
Admission: RE | Admit: 2020-06-15 | Discharge: 2020-06-15 | Disposition: A | Discharge status: Home / Self Care | Payer: PPO | Source: Ambulatory Visit | Attending: Neurology | Admitting: Neurology

## 2020-06-15 ENCOUNTER — Other Ambulatory Visit: Payer: Self-pay

## 2020-06-15 DIAGNOSIS — M542 Cervicalgia: Secondary | ICD-10-CM | POA: Diagnosis not present

## 2020-06-17 ENCOUNTER — Telehealth: Payer: Self-pay | Admitting: Neurology

## 2020-06-17 NOTE — Telephone Encounter (Signed)
MRI of the cervical spine shows bilateral severe foraminal stenosis at the C6-7 and C4-5 levels, and severe right foraminal stenosis at C5-6 level.  No spinal cord compression, but evidence abnormalities that could lead to neck discomfort.  It is possible that if the neck pain is becoming a predominant symptom, and epidural of the cervical spine can be done in the future.  MRI cervical 06/17/20:  IMPRESSION:   MRI cervical spine (without) demonstrating: - At C4-5 disc bulging and facet hypertrophy with mild spinal stenosis and severe bilateral foraminal stenosis - At C6-7 disc bulging and facet hypertrophy with severe bilateral foraminal stenosis - At C5-6 disc bulging and facet hypertrophy with severe right foraminal stenosis - At C3-4 facet hypertrophy with severe left foraminal stenosis

## 2020-06-24 DIAGNOSIS — M1711 Unilateral primary osteoarthritis, right knee: Secondary | ICD-10-CM | POA: Diagnosis not present

## 2020-06-28 DIAGNOSIS — E538 Deficiency of other specified B group vitamins: Secondary | ICD-10-CM | POA: Diagnosis not present

## 2020-07-06 DIAGNOSIS — M1711 Unilateral primary osteoarthritis, right knee: Secondary | ICD-10-CM | POA: Diagnosis not present

## 2020-07-13 DIAGNOSIS — M1711 Unilateral primary osteoarthritis, right knee: Secondary | ICD-10-CM | POA: Diagnosis not present

## 2020-07-13 DIAGNOSIS — M179 Osteoarthritis of knee, unspecified: Secondary | ICD-10-CM | POA: Diagnosis not present

## 2020-07-28 DIAGNOSIS — E538 Deficiency of other specified B group vitamins: Secondary | ICD-10-CM | POA: Diagnosis not present

## 2020-08-18 ENCOUNTER — Encounter: Payer: Self-pay | Admitting: Neurology

## 2020-08-18 ENCOUNTER — Ambulatory Visit: Payer: PPO | Admitting: Neurology

## 2020-08-18 VITALS — BP 120/71 | HR 76 | Ht 67.0 in | Wt 170.8 lb

## 2020-08-18 DIAGNOSIS — M48061 Spinal stenosis, lumbar region without neurogenic claudication: Secondary | ICD-10-CM | POA: Diagnosis not present

## 2020-08-18 DIAGNOSIS — R413 Other amnesia: Secondary | ICD-10-CM

## 2020-08-18 DIAGNOSIS — M47812 Spondylosis without myelopathy or radiculopathy, cervical region: Secondary | ICD-10-CM | POA: Diagnosis not present

## 2020-08-18 HISTORY — DX: Spinal stenosis, lumbar region without neurogenic claudication: M48.061

## 2020-08-18 HISTORY — DX: Other amnesia: R41.3

## 2020-08-18 MED ORDER — GABAPENTIN 100 MG PO CAPS: 200.0000 mg | ORAL_CAPSULE | Freq: Three times a day (TID) | ORAL | 3 refills | Status: AC

## 2020-08-18 MED ORDER — DONEPEZIL HCL 5 MG PO TABS: 5.0000 mg | ORAL_TABLET | Freq: Every day | ORAL | 1 refills | Status: DC

## 2020-08-18 NOTE — Progress Notes (Signed)
Reason for visit: Chronic neck, low back pain, mild gait disturbance, memory disturbance  Jasmine Copeland is an 80 y.o. female  History of present illness:  Jasmine Copeland is a 80 year old right-handed white female with a history of some mild memory changes.  The patient has undergone MRI of the brain showing a mild to moderate level of cortical atrophy.  No significant small vessel disease was noted.  The patient has had some sensory alteration in the feet and slight balance changes.  Evaluation reveals evidence of severe spinal stenosis at the L4-5 level.  The patient underwent an epidural steroid injection without much benefit with the low back pain.  She has been on a low-dose gabapentin 100 mg 3 times daily that has helped her foot discomfort.  The patient also complains of significant neck and shoulder discomfort, MRI of the cervical spine showed multilevel spondylosis, no spinal stenosis was seen.  The patient also complains of some swelling in both legs, left greater than right.  She returns to the office today for an evaluation.  Past Medical History:  Diagnosis Date   Arthralgia    Arthritis    Balance problem    Bilateral leg weakness    Bruises easily    Cataract    Depression    High cholesterol    Hyperlipidemia    Hypertension    Impetigo    MDD (major depressive disorder)    Mixed dyslipidemia    Osteoporosis    Peripheral edema    Peripheral neuropathy    Vitamin B 12 deficiency     Past Surgical History:  Procedure Laterality Date   ABDOMINAL HYSTERECTOMY  1978   BREAST SURGERY     CATARACT EXTRACTION     tonsill surgery      Family History  Problem Relation Age of Onset   Cancer Mother    Alzheimer's disease Mother    Heart attack Father    Hypertension Brother     Social history:  reports that she has never smoked. She has never used smokeless tobacco. She reports that she does not drink alcohol and does not use drugs.    Allergies  Allergen  Reactions   Augmentin [Amoxicillin-Pot Clavulanate]     C-diff, mental status change   Levaquin [Levofloxacin In D5w]     Insomnia    Medications:  Prior to Admission medications   Medication Sig Start Date End Date Taking? Authorizing Provider  amLODipine (NORVASC) 5 MG tablet Take 1 tablet by mouth daily. 02/11/20  Yes [provider]  aspirin 81 MG tablet Take 81 mg by mouth daily.   Yes [provider]  Calcium Carb-Cholecalciferol [phone] MG-UNIT TABS Take 1 tablet by mouth daily.   Yes [provider]  celecoxib (CELEBREX) 200 MG capsule Take 200 mg by mouth in the morning and at bedtime. 11/11/19  Yes [provider]  gabapentin (NEURONTIN) 100 MG capsule Take 1 capsule (100 mg total) by mouth 3 (three) times daily. 04/13/20  Yes York Spaniel, MD  lisinopril-hydrochlorothiazide (ZESTORETIC) 20-12.5 MG tablet Take 1 tablet by mouth daily. 06/25/19  Yes [provider]  mirtazapine (REMERON) 15 MG tablet Take 25 mg by mouth at bedtime. 03/12/20  Yes [provider]  pravastatin (PRAVACHOL) 40 MG tablet Take 20 mg by mouth daily.    Yes [provider]  vitamin B-12 (CYANOCOBALAMIN) 1000 MCG tablet Take 2,000 mcg by mouth daily.   Yes [provider]  ROS:  Out of a complete 14 system review of symptoms, the patient complains only of the following symptoms, and all other reviewed systems are negative.  Leg swelling Back and leg pain Mild gait instability Memory problems  Blood pressure 120/71, pulse 76, height 5\' 7"  (1.702 m), weight 170 lb 12.8 oz (77.5 kg).  Physical Exam  General: The patient is alert and cooperative at the time of the examination.  Skin: 1+ edema was seen in both lower extremities below the knees.   Neurologic Exam  Mental status: The patient is alert and oriented x 3 at the time of the examination. The Mini-Mental status examination done today shows a total score 20/30.  The  patient is able to name 15 four-legged animals in 60 seconds.   Cranial nerves: Facial symmetry is present. Speech is normal, no aphasia or dysarthria is noted. Extraocular movements are full. Visual fields are full.  Motor: The patient has good strength in all 4 extremities.  Sensory examination: Soft touch sensation is symmetric on the face, arms, and legs.  Coordination: The patient has good finger-nose-finger and heel-to-shin bilaterally.  Gait and station: The patient has a normal gait. Tandem gait is slightly unsteady.  Romberg is negative. No drift is seen.  Reflexes: Deep tendon reflexes are symmetric, with exception of a slight decrease in the right ankle jerk reflexes compared to the left.   MRI cervical 06/17/20:   IMPRESSION:   MRI cervical spine (without) demonstrating: - At C4-5 disc bulging and facet hypertrophy with mild spinal stenosis and severe bilateral foraminal stenosis - At C6-7 disc bulging and facet hypertrophy with severe bilateral foraminal stenosis - At C5-6 disc bulging and facet hypertrophy with severe right foraminal stenosis - At C3-4 facet hypertrophy with severe left foraminal stenosis   MRI lumbar 06/01/20:   IMPRESSION: This MRI of the lumbar spine without contrast shows the following: 1.   At L3-L4, there is moderate spinal stenosis due to degenerative changes.  There is no significant foraminal narrowing or lateral recess stenosis.  No nerve root compression. 2.   At L4-L5, there is severe spinal stenosis (AP diameter 6.0 mm) due to anterolisthesis and other degenerative changes.  There is moderate to severe right lateral recess stenosis with potential for right L5 nerve root compression. 3.   At L5-S1, there is severe loss of disc height with possible degenerative fusion and facet hypertrophy but no nerve root compression or spinal stenosis.  * MRI scan images were reviewed online. I agree with the written report.     Assessment/Plan:  1.   Mild memory disturbance  2.  Cervical spondylosis, neck pain  3.  Lumbar spinal stenosis, chronic low back pain  The patient is having ongoing neuromuscular discomfort in the neck and shoulders and low back.  We will increase the gabapentin to 200 mg 3 times daily.  The patient reports some swelling in the legs that started around the time she began amlodipine, but the gabapentin may add to this.  The patient be set up for physical therapy for the neck and low back.  Given the memory problems, we will start low-dose Aricept taking 5 mg at night.  If she tolerates this for 1 month, she will call our office and we will start 10 mg at night.  She will follow-up here in 5 to 6 months, in the future she can be seen through Dr. 06/03/20.  Vickey Huger MD 08/18/2020 1:27 PM  [facility] [phone]  [place], [place] [address]  Phone [phone] Fax [phone]

## 2020-08-18 NOTE — Patient Instructions (Signed)
We will go up on gabapentin for the neck and low back pain. Take 200 mg three times a day.  Neurontin (gabapentin) may result in drowsiness, ankle swelling, gait instability, or possibly dizziness. Please contact our office if significant side effects occur with this medication.   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  Begin Aricept (donepezil) at 5 mg at night for one month. If this medication is well-tolerated, please call our office and we will call in a prescription for the 10 mg tablets. Look out for side effects that may include nausea, diarrhea, weight loss, or stomach cramps. This medication will also cause a runny nose, therefore there is no need for allergy medications for this purpose.

## 2020-08-23 ENCOUNTER — Telehealth: Payer: Self-pay | Admitting: Neurology

## 2020-08-23 NOTE — Telephone Encounter (Signed)
Faxed referral to [facility]. Ph # [phone] fax # [phone]

## 2020-08-26 DIAGNOSIS — M1711 Unilateral primary osteoarthritis, right knee: Secondary | ICD-10-CM | POA: Diagnosis not present

## 2020-08-30 DIAGNOSIS — E538 Deficiency of other specified B group vitamins: Secondary | ICD-10-CM | POA: Diagnosis not present

## 2020-10-01 DIAGNOSIS — E538 Deficiency of other specified B group vitamins: Secondary | ICD-10-CM | POA: Diagnosis not present

## 2020-10-05 DIAGNOSIS — Z1231 Encounter for screening mammogram for malignant neoplasm of breast: Secondary | ICD-10-CM | POA: Diagnosis not present

## 2020-10-14 DIAGNOSIS — Z23 Encounter for immunization: Secondary | ICD-10-CM | POA: Diagnosis not present

## 2020-11-16 DIAGNOSIS — Z79899 Other long term (current) drug therapy: Secondary | ICD-10-CM | POA: Diagnosis not present

## 2020-11-16 DIAGNOSIS — E782 Mixed hyperlipidemia: Secondary | ICD-10-CM | POA: Diagnosis not present

## 2020-11-16 DIAGNOSIS — F3341 Major depressive disorder, recurrent, in partial remission: Secondary | ICD-10-CM | POA: Diagnosis not present

## 2020-11-16 DIAGNOSIS — Z1211 Encounter for screening for malignant neoplasm of colon: Secondary | ICD-10-CM | POA: Diagnosis not present

## 2020-11-16 DIAGNOSIS — Z0000 Encounter for general adult medical examination without abnormal findings: Secondary | ICD-10-CM | POA: Diagnosis not present

## 2020-11-16 DIAGNOSIS — E538 Deficiency of other specified B group vitamins: Secondary | ICD-10-CM | POA: Diagnosis not present

## 2020-11-16 DIAGNOSIS — M159 Polyosteoarthritis, unspecified: Secondary | ICD-10-CM | POA: Diagnosis not present

## 2020-11-16 DIAGNOSIS — M549 Dorsalgia, unspecified: Secondary | ICD-10-CM | POA: Diagnosis not present

## 2020-11-16 DIAGNOSIS — I10 Essential (primary) hypertension: Secondary | ICD-10-CM | POA: Diagnosis not present

## 2021-03-30 ENCOUNTER — Telehealth: Payer: Self-pay | Admitting: Neurology

## 2021-03-30 DIAGNOSIS — M545 Low back pain, unspecified: Secondary | ICD-10-CM

## 2021-03-30 DIAGNOSIS — M48061 Spinal stenosis, lumbar region without neurogenic claudication: Secondary | ICD-10-CM

## 2021-03-30 DIAGNOSIS — M48062 Spinal stenosis, lumbar region with neurogenic claudication: Secondary | ICD-10-CM

## 2021-03-30 DIAGNOSIS — M47812 Spondylosis without myelopathy or radiculopathy, cervical region: Secondary | ICD-10-CM

## 2021-03-30 DIAGNOSIS — M542 Cervicalgia: Secondary | ICD-10-CM

## 2021-03-30 NOTE — Telephone Encounter (Signed)
Pt having a dull hard pain from neck to top of legs. Have sit down sometimes when the pain get so bad.  ?Pt scheduled appt. Have scheduled an appt on 06/01/21 at 1:30p. ?

## 2021-03-30 NOTE — Telephone Encounter (Addendum)
Called and LVM returning pt call. I was going to get more info from her. Wanting to confirm she is taking gabapentin 100mg , 2 caps by mouth three times daily. Wanting to know when pain started to worsen? Any injuries? Did she complete physical therapy that Dr. Jannifer Franklin ordered at last visit?  ? ? ?Pt called back, took call from phone staff and spoke with pt. She reports she never completed PT that Dr. Jannifer Franklin ordered. Would like new referral placed to go to [facility] in [place] instead. She will call to schedule and try and complete prior to appt in May w/ Dr. Brett Fairy. I also asked if she was taking donepezil for memory. She only took x1week d/t SE of bad dreams. Does not want to take this. I updated chart. ? ?

## 2021-03-31 ENCOUNTER — Telehealth: Payer: Self-pay | Admitting: Neurology

## 2021-03-31 NOTE — Telephone Encounter (Signed)
Can Mrs. Jasmine Copeland get PT at the desired location through her Family doctor/  ? ?  ?There is really no need to see a neurologist for  gabapentin. She may get better results with rehab and ortho.  ? ?She also could not tolerate Aricept generic( not a rare occurrence) .  ?If she hasn't yet tried namenda , that  would be next to go to.  ?And most memory patients follow their PCP after the diagnosis is established.  ? ?Melvyn Novas, MD  ?

## 2021-03-31 NOTE — Telephone Encounter (Signed)
Referral sent to [facility] [place] Location [phone]. ?

## 2021-06-01 ENCOUNTER — Ambulatory Visit: Payer: PPO | Admitting: Neurology

## 2023-01-07 IMAGING — XA Imaging study
2 series · 2 of 2 positions shown · non-contrast
Comparison: none

CLINICAL DATA: 79-year-old female with lumbosacral spondylosis
without myelopathy. She has neurogenic claudication. MRI the lumbar
spine demonstrates multilevel degenerative disc disease. She
presents for transforaminal epidural steroid injection on the left
at L4-L5.

[Series 1: ortho standard · 1 of 1 slices shown (1 of 2)]
[im 1/1]
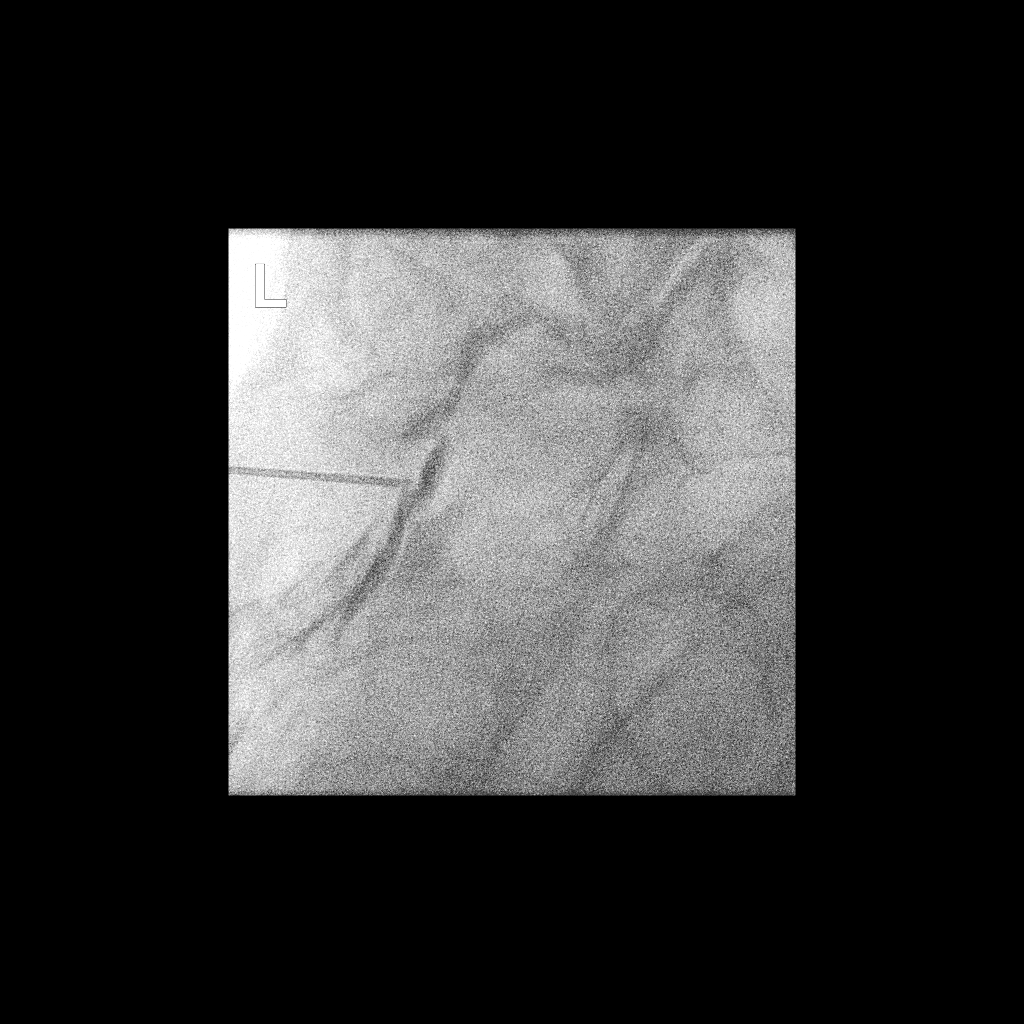

[Series 2: ortho standard · 1 of 1 slices shown (2 of 2)]
[im 1/1]
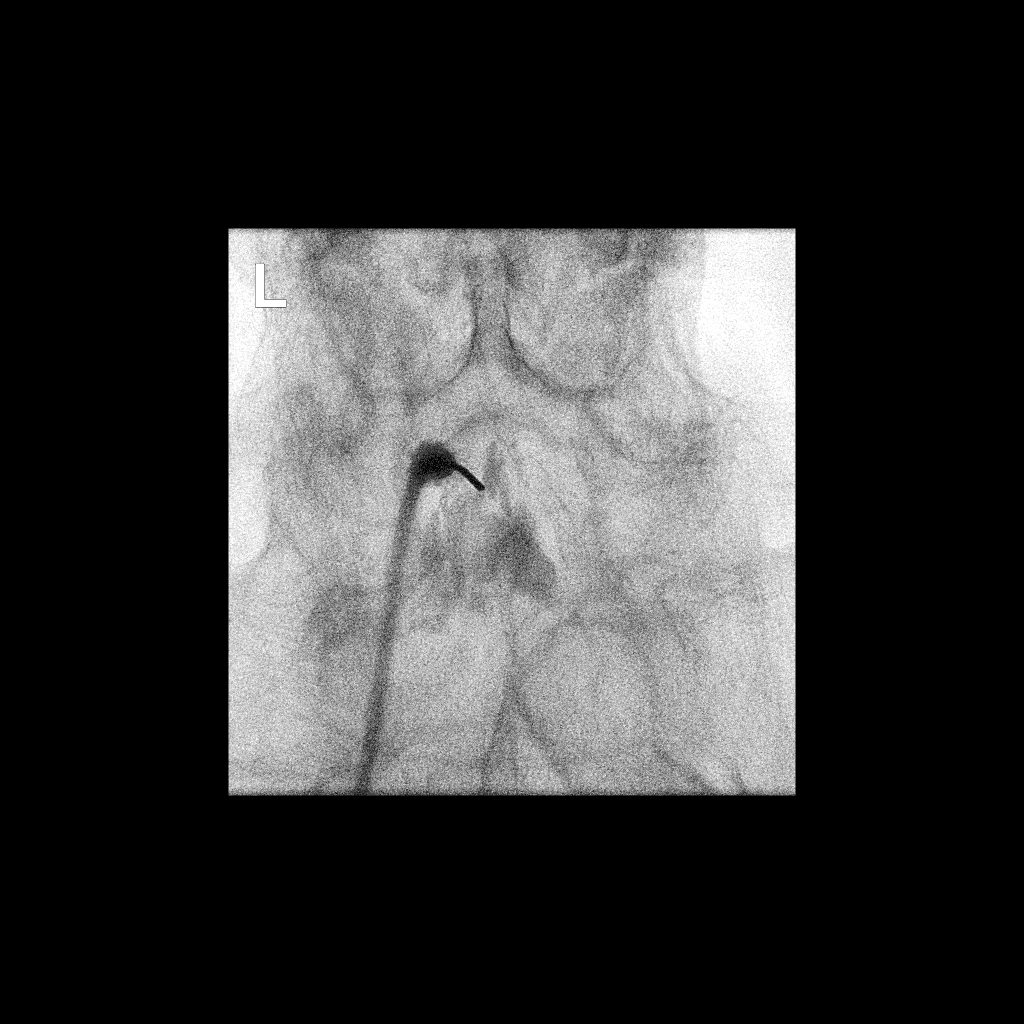

[2 of 2 positions shown; findings below may reference images not displayed]

FLUOROSCOPY TIME:  0 minutes 35 seconds 3.4 mGy

PROCEDURE:
The procedure, risks, benefits, and alternatives were explained to
the patient. Questions regarding the procedure were encouraged and
answered. The patient understands and consents to the procedure.

LUMBAR EPIDURAL INJECTION:

An interlaminar approach was performed on left at L4-L5. The
overlying skin was cleansed and anesthetized. A 20 gauge epidural
needle was advanced using loss-of-resistance technique.

DIAGNOSTIC EPIDURAL INJECTION:

Injection of Isovue-M 200 shows a good epidural pattern with spread
above and below the level of needle placement, primarily on the left
no vascular opacification is seen.

THERAPEUTIC EPIDURAL INJECTION:

80 mg of Depo-Medrol mixed with 2 mL 1% lidocaine were instilled.
The procedure was well-tolerated, and the patient was discharged
thirty minutes following the injection in good condition.

COMPLICATIONS:
None
IMPRESSION: Technically successful epidural injection on the left L4-L5 #1.

## 2024-02-21 ENCOUNTER — Ambulatory Visit: Admitting: Podiatry

## 2024-02-28 ENCOUNTER — Ambulatory Visit: Admitting: Podiatry
